# Patient Record
Sex: Female | Born: 1990
Health system: Southern US, Community
[De-identification: ages and names within clinical notes are randomized; demographics above are authoritative.]

## PROBLEM LIST (undated history)

## (undated) ENCOUNTER — Inpatient Hospital Stay (HOSPITAL_COMMUNITY): Payer: Self-pay

## (undated) DIAGNOSIS — D649 Anemia, unspecified: Secondary | ICD-10-CM

## (undated) DIAGNOSIS — F419 Anxiety disorder, unspecified: Secondary | ICD-10-CM

## (undated) DIAGNOSIS — F99 Mental disorder, not otherwise specified: Secondary | ICD-10-CM

## (undated) DIAGNOSIS — B999 Unspecified infectious disease: Secondary | ICD-10-CM

## (undated) DIAGNOSIS — R87619 Unspecified abnormal cytological findings in specimens from cervix uteri: Secondary | ICD-10-CM

## (undated) DIAGNOSIS — IMO0002 Reserved for concepts with insufficient information to code with codable children: Secondary | ICD-10-CM

## (undated) DIAGNOSIS — A599 Trichomoniasis, unspecified: Secondary | ICD-10-CM

## (undated) HISTORY — PX: NO PAST SURGERIES: SHX2092

## (undated) HISTORY — PX: INDUCED ABORTION: SHX677

---
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
## 2009-06-08 ENCOUNTER — Emergency Department (HOSPITAL_COMMUNITY): Admission: EM | Admit: 2009-06-08 | Discharge: 2009-06-08 | Payer: Self-pay | Admitting: Emergency Medicine

## 2009-12-08 ENCOUNTER — Emergency Department (HOSPITAL_COMMUNITY): Admission: EM | Admit: 2009-12-08 | Discharge: 2009-12-08 | Payer: Self-pay | Admitting: Emergency Medicine

## 2010-06-15 ENCOUNTER — Emergency Department (HOSPITAL_COMMUNITY): Admission: EM | Admit: 2010-06-15 | Discharge: 2010-06-15 | Payer: Self-pay | Admitting: Emergency Medicine

## 2010-11-16 ENCOUNTER — Emergency Department (HOSPITAL_COMMUNITY)
Admission: EM | Admit: 2010-11-16 | Discharge: 2010-11-16 | Disposition: A | Discharge status: Home / Self Care | Payer: 59 | Attending: Emergency Medicine | Admitting: Emergency Medicine

## 2010-11-16 ENCOUNTER — Emergency Department (HOSPITAL_COMMUNITY): Payer: 59

## 2010-11-16 DIAGNOSIS — F17200 Nicotine dependence, unspecified, uncomplicated: Secondary | ICD-10-CM | POA: Insufficient documentation

## 2010-11-16 DIAGNOSIS — R059 Cough, unspecified: Secondary | ICD-10-CM | POA: Insufficient documentation

## 2010-11-16 DIAGNOSIS — R05 Cough: Secondary | ICD-10-CM | POA: Insufficient documentation

## 2010-11-16 DIAGNOSIS — J45901 Unspecified asthma with (acute) exacerbation: Secondary | ICD-10-CM | POA: Insufficient documentation

## 2011-09-16 ENCOUNTER — Encounter (HOSPITAL_COMMUNITY): Payer: Self-pay

## 2011-09-16 ENCOUNTER — Inpatient Hospital Stay (HOSPITAL_COMMUNITY): Payer: 59

## 2011-09-16 ENCOUNTER — Inpatient Hospital Stay (HOSPITAL_COMMUNITY)
Admission: AD | Admit: 2011-09-16 | Discharge: 2011-09-16 | Disposition: A | Discharge status: Home / Self Care | Payer: 59 | Source: Ambulatory Visit | Attending: Obstetrics & Gynecology | Admitting: Obstetrics & Gynecology

## 2011-09-16 DIAGNOSIS — R42 Dizziness and giddiness: Secondary | ICD-10-CM | POA: Insufficient documentation

## 2011-09-16 DIAGNOSIS — O26859 Spotting complicating pregnancy, unspecified trimester: Secondary | ICD-10-CM | POA: Insufficient documentation

## 2011-09-16 DIAGNOSIS — O26851 Spotting complicating pregnancy, first trimester: Secondary | ICD-10-CM

## 2011-09-16 HISTORY — DX: Anemia, unspecified: D64.9

## 2011-09-16 LAB — URINALYSIS, ROUTINE W REFLEX MICROSCOPIC
Bilirubin Urine: NEGATIVE
Glucose, UA: NEGATIVE mg/dL
Hgb urine dipstick: NEGATIVE
Ketones, ur: NEGATIVE mg/dL
Leukocytes, UA: NEGATIVE
Nitrite: NEGATIVE
Protein, ur: NEGATIVE mg/dL
Specific Gravity, Urine: 1.015 (ref 1.005–1.030)
Urobilinogen, UA: 0.2 mg/dL (ref 0.0–1.0)
pH: 7 (ref 5.0–8.0)

## 2011-09-16 LAB — ABO/RH: ABO/RH(D): O POS

## 2011-09-16 LAB — POCT PREGNANCY, URINE: Preg Test, Ur: POSITIVE — AB

## 2011-09-16 LAB — HCG, QUANTITATIVE, PREGNANCY: hCG, Beta Chain, Quant, S: 10443 m[IU]/mL — ABNORMAL HIGH (ref <5)

## 2011-09-16 LAB — GLUCOSE, CAPILLARY: Glucose-Capillary: 92 mg/dL (ref 70–99)

## 2011-09-16 LAB — PREGNANCY, URINE: Preg Test, Ur: POSITIVE — AB

## 2011-09-16 NOTE — ED Provider Notes (Signed)
History   Amber Fitzpatrick is a 21 y.o. year old G1P0 female at 6.[redacted] weeks gestation by LMP who presents to [facility] reporting spotting and dizziness every morning for a 1 week.    CSN: [id number]  Arrival date & time 09/16/11  1207   None     Chief Complaint  Patient presents with  . Dizziness  . Vaginal Bleeding  . Abdominal Pain    (Consider location/radiation/quality/duration/timing/severity/associated sxs/prior treatment) HPI  Past Medical History  Diagnosis Date  . Asthma   . Anemia   . MRSA (methicillin resistant Staphylococcus aureus)     Past Surgical History  Procedure Date  . Dilation and curettage of uterus     Family History  Problem Relation Age of Onset  . Anesthesia problems Neg Hx     History  Substance Use Topics  . Smoking status: Current Everyday Smoker -- 0.5 packs/day    Types: Cigarettes  . Smokeless tobacco: Never Used  . Alcohol Use: Yes    OB History    Grav Para Term Preterm Abortions TAB SAB Ect Mult Living   1               Review of Systems  Constitutional: Negative for fever and chills.  Gastrointestinal: Positive for abdominal pain (occassional cramping, R>L). Negative for nausea and vomiting.  Genitourinary: Negative for dysuria and vaginal bleeding.  Neurological: Positive for dizziness. Negative for syncope and weakness.    Allergies  Review of patient's allergies indicates no known allergies.  Home Medications  No current outpatient prescriptions on file.  BP 113/78  Pulse 91  Temp(Src) 98 F (36.7 C) (Oral)  Resp 18  Ht 5' 3.5" (1.613 m)  Wt 62.596 kg (138 lb)  BMI 24.06 kg/m2  SpO2 100%  LMP 08/02/2011  Physical Exam  Constitutional: She is oriented to person, place, and time. She appears well-developed and well-nourished. No distress.  HENT:  Head: Normocephalic.  Eyes: Pupils are equal, round, and reactive to light.  Cardiovascular: Normal rate and regular rhythm.   Pulmonary/Chest: Effort normal.    Abdominal: Soft. There is no tenderness.  Genitourinary: Vagina normal and uterus normal. No bleeding around the vagina.  Neurological: She is alert and oriented to person, place, and time.  Skin: Skin is warm and dry.  Psychiatric: She has a normal mood and affect.    ED Course  Procedures (including critical care time)  Recent Results (from the past 168 hour(s))  URINALYSIS, ROUTINE W REFLEX MICROSCOPIC   Collection Time   09/16/11 12:50 PM      Component Value Range   Color, Urine YELLOW  YELLOW    APPearance CLEAR  CLEAR    Specific Gravity, Urine 1.015  1.005 - 1.030    pH 7.0  5.0 - 8.0    Glucose, UA NEGATIVE  NEGATIVE (mg/dL)   Hgb urine dipstick NEGATIVE  NEGATIVE    Bilirubin Urine NEGATIVE  NEGATIVE    Ketones, ur NEGATIVE  NEGATIVE (mg/dL)   Protein, ur NEGATIVE  NEGATIVE (mg/dL)   Urobilinogen, UA 0.2  0.0 - 1.0 (mg/dL)   Nitrite NEGATIVE  NEGATIVE    Leukocytes, UA NEGATIVE  NEGATIVE   PREGNANCY, URINE   Collection Time   09/16/11 12:50 PM      Component Value Range   Preg Test, Ur POSITIVE (*) NEGATIVE   POCT PREGNANCY, URINE   Collection Time   09/16/11 12:55 PM      Component Value Range  Preg Test, Ur POSITIVE (*) NEGATIVE   GC/CHLAMYDIA PROBE AMP, GENITAL   Collection Time   09/16/11  1:25 PM      Component Value Range   GC Probe Amp, Genital NEGATIVE  NEGATIVE    Chlamydia, DNA Probe NEGATIVE  NEGATIVE   WET PREP, GENITAL   Collection Time   09/16/11  1:25 PM      Component Value Range   Yeast Wet Prep HPF POC NONE SEEN  NONE SEEN    Trich, Wet Prep NONE SEEN  NONE SEEN    Clue Cells Wet Prep HPF POC MODERATE (*) NONE SEEN    WBC, Wet Prep HPF POC FEW (*) NONE SEEN   ABO/RH   Collection Time   09/16/11  1:40 PM      Component Value Range   ABO/RH(D) O POS    HCG, QUANTITATIVE, PREGNANCY   Collection Time   09/16/11  1:40 PM      Component Value Range   hCG, Beta Chain, Quant, S 10443 (*) <5 (mIU/mL)  GLUCOSE, CAPILLARY   Collection Time    09/16/11  1:43 PM      Component Value Range   Glucose-Capillary 92  70 - 99 (mg/dL)   US show 6.0 week GS, +YS, -FP or cardiac activity. CLC on right  Dizziness resolved w/ snack.  1. Spotting complicating pregnancy in first trimester   Dizziness possible due to pregnancy-related hypoglycemia   MDM  D/C home  Start [facility] SAB precautions Recommend frequent snacks and pushing fluids  Dorathy Kinsman 09/16/2011 3:18 PM

## 2011-09-16 NOTE — Discharge Instructions (Signed)
Prenatal Care Providers °[facility]    [facility] ° Phone- [phone]     Phone: [phone] °         °[facility]                      [facility] ° @[place]     Phone: [phone] ° Phone: [phone] °        [facility] °[facility]     Phone: [phone] ° Phone: [phone]   °        [facility] °[facility] [place]                hone: [phone] ° Phone: [phone] °        [facility] °Dr. Bernard Marshall      Phone: [phone] ° Phone: [phone] °        [facility] °[facility]                Phone: [phone] ° Women’s Health  ° Phone:[phone]    [facility] °         Phone: [phone] °[facility] °  Phone: [phone] °

## 2011-09-16 NOTE — Progress Notes (Signed)
Pt states had positive upt last week. LMP-08/02/2011, usually has regular menstrual cycles. Spotting began 1 day after she was supposed to come on this month's cycle, then noted spotting today. These are the only instances where blood was noted. Last intercourse last pm. Denies pain at present. Feels like gas pains, suprapubic cramping. Has felt dizzy upon standing in mornings only. None in afternoon.

## 2011-09-17 NOTE — ED Provider Notes (Signed)
Attestation of Attending Supervision of Advanced Practitioner: Evaluation and management procedures were performed by the PA/NP/CNM/OB Fellow under my supervision/collaboration. Chart reviewed, and agree with management and plan.  Jaynie Collins, M.D. 09/17/2011 5:41 PM

## 2011-09-22 ENCOUNTER — Encounter (HOSPITAL_COMMUNITY): Payer: Self-pay | Admitting: *Deleted

## 2011-09-22 ENCOUNTER — Emergency Department (HOSPITAL_COMMUNITY)
Admission: EM | Admit: 2011-09-22 | Discharge: 2011-09-22 | Disposition: A | Discharge status: Home / Self Care | Payer: 59 | Attending: Emergency Medicine | Admitting: Emergency Medicine

## 2011-09-22 DIAGNOSIS — F4320 Adjustment disorder, unspecified: Secondary | ICD-10-CM

## 2011-09-22 DIAGNOSIS — IMO0002 Reserved for concepts with insufficient information to code with codable children: Secondary | ICD-10-CM | POA: Insufficient documentation

## 2011-09-22 DIAGNOSIS — F17200 Nicotine dependence, unspecified, uncomplicated: Secondary | ICD-10-CM | POA: Insufficient documentation

## 2011-09-22 DIAGNOSIS — Z8614 Personal history of Methicillin resistant Staphylococcus aureus infection: Secondary | ICD-10-CM | POA: Insufficient documentation

## 2011-09-22 DIAGNOSIS — R45851 Suicidal ideations: Secondary | ICD-10-CM

## 2011-09-22 HISTORY — DX: Mental disorder, not otherwise specified: F99

## 2011-09-22 HISTORY — DX: Anxiety disorder, unspecified: F41.9

## 2011-09-22 LAB — ACETAMINOPHEN LEVEL: Acetaminophen (Tylenol), Serum: <15 ug/mL (ref 10–30)

## 2011-09-22 LAB — COMPREHENSIVE METABOLIC PANEL
ALT: 8 U/L (ref 0–35)
Alkaline Phosphatase: 36 U/L — ABNORMAL LOW (ref 39–117)
BUN: 8 mg/dL (ref 6–23)
Chloride: 105 mEq/L (ref 96–112)
GFR calc Af Amer: >90 mL/min (ref >90)
Glucose, Bld: 79 mg/dL (ref 70–99)
Potassium: 3.4 mEq/L — ABNORMAL LOW (ref 3.5–5.1)
Sodium: 136 mEq/L (ref 135–145)
Total Bilirubin: 0.2 mg/dL — ABNORMAL LOW (ref 0.3–1.2)

## 2011-09-22 LAB — ETHANOL: Alcohol, Ethyl (B): <11 mg/dL (ref 0–11)

## 2011-09-22 LAB — RAPID URINE DRUG SCREEN, HOSP PERFORMED
Amphetamines: POSITIVE — AB
Barbiturates: NOT DETECTED

## 2011-09-22 LAB — SALICYLATE LEVEL: Salicylate Lvl: <2 mg/dL — ABNORMAL LOW (ref 2.8–20.0)

## 2011-09-22 LAB — CBC
HCT: 32.6 % — ABNORMAL LOW (ref 36.0–46.0)
Hemoglobin: 11.1 g/dL — ABNORMAL LOW (ref 12.0–15.0)
RBC: 3.95 MIL/uL (ref 3.87–5.11)
WBC: 11.9 10*3/uL — ABNORMAL HIGH (ref 4.0–10.5)

## 2011-09-22 MED ORDER — POTASSIUM CHLORIDE 20 MEQ/15ML (10%) PO LIQD
20.0000 meq | Freq: Once | ORAL | Status: AC
  Administered 2011-09-22: 20 meq via ORAL
  Filled 2011-09-22: qty 15

## 2011-09-22 MED ORDER — ONDANSETRON HCL 4 MG PO TABS: 4.0000 mg | ORAL_TABLET | Freq: Three times a day (TID) | ORAL | Status: DC | PRN

## 2011-09-22 MED ORDER — ACETAMINOPHEN 325 MG PO TABS: 650.0000 mg | ORAL_TABLET | ORAL | Status: DC | PRN

## 2011-09-22 MED ORDER — LORAZEPAM 1 MG PO TABS: 1.0000 mg | ORAL_TABLET | Freq: Three times a day (TID) | ORAL | Status: DC | PRN

## 2011-09-22 NOTE — Discharge Instructions (Signed)
Adjustment Disorder Most changes in life can cause stress. Getting used to changes may take a few months or longer. If feelings of stress, hopelessness, or worry continue, you may have an adjustment disorder. This stress-related mental health problem may affect your feelings, thinking and how you act. It occurs in both sexes and happens at any age. SYMPTOMS  Some of the following problems may be seen and vary from person to person:  Sadness or depression.   Loss of enjoyment.   Thoughts of suicide.   Fighting.   Avoiding family and friends.   Poor school performance.   Hopelessness, sense of loss.   Trouble sleeping.   Vandalism.   Worry, weight loss or gain.   Crying spells.   Anxiety   Reckless driving.   Skipping school.   Poor work International aid/development worker.   Nervousness.   Ignoring bills.   Poor attitude.  DIAGNOSIS  Your caregiver will ask what has happened in your life and do a physical exam. They will make a diagnosis of an adjustment disorder when they are sure another problem or medical illness causing your feelings does not exist. TREATMENT  When problems caused by stress interfere with you daily life or last longer than a few months, you may need counseling for an adjustment disorder. Early treatment may diminish problems and help you to better cope with the stressful events in your life. Sometimes medication is necessary. Individual counseling and or support groups can be very helpful. PROGNOSIS  Adjustment disorders usually last less than 3 to 6 months. The condition may persist if there is long lasting stress. This could include health problems, relationship problems, or job difficulties where you can not easily escape from what is causing the problem. PREVENTION  Even the most mentally healthy, highly functioning people can suffer from an adjustment disorder given a significant blow from a life-changing event. There is no way to prevent pain and loss. Most people  need help from time to time. You are not alone. SEEK MEDICAL CARE IF:  Your feelings or symptoms listed above do not improve or worsen. Document Released: 03/10/2006 Document Revised: 06/25/2011 Document Reviewed: 06/01/2007 [facility] Patient Information 2012 [place], [place].  RESOURCE GUIDE  Dental Problems  Patients with Medicaid: [facility] [phone]                                           [phone] [address] [employer] Phone:  [phone]                                                   Phone:  [phone]  If unable to pay or uninsured, contact:  [facility] or [facility]. to become qualified for the adult dental clinic.  Chronic Pain Problems Contact [facility]  [phone] Patients need to be referred by their primary care doctor.  Insufficient Money for Medicine Contact United Way:  call "211" or [facility] [phone].  No Primary Care Doctor Call [facility]  [phone] Other agencies that provide inexpensive medical care    [facility]  Family Medicine  [phone]    [facility] Internal [facility]  [phone]    [facility]  [phone]    [facility]  [phone]    [facility]  [phone]    [facility]  [phone]  Psychological Services [facility]  [phone] [facility]  [phone] [facility]   [phone] (emergency services [phone])  Abuse/Neglect [facility] Child Abuse Hotline [phone] [facility] Child Abuse Hotline [phone] (After Hours)  Emergency Shelter [facility] [phone]  [facility] [phone] [facility] [phone]  MRSA Hotline #:   708-275-7371    [facility] Resources  [facility]  United Way                           [facility] Dept. [address]  [place]                     [address]         [address]  Blondell Reveal Phone:  [phone]                                  Phone:  [phone]                   Phone:  [phone]  [facility] Mental Health Phone:  [phone]  [facility] Child Abuse Hotline [phone] (After Hours)
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
## 2011-09-22 NOTE — ED Notes (Signed)
Pt states that she just found out she is [redacted] weeks pregnant, wanted to try to have an abortion this morning but father of baby doesn't want her to, states she is afraid to tell her mother. Pt is tearful and states she just doesn't want to be here anymore.

## 2011-09-22 NOTE — ED Notes (Signed)
Pt in via police, c/o SI, police state she came up to car and stated she wanted to hurt herself, pt tearful upon arrival, car is at [employer]
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
## 2011-10-30 ENCOUNTER — Emergency Department (INDEPENDENT_AMBULATORY_CARE_PROVIDER_SITE_OTHER)
Admission: EM | Admit: 2011-10-30 | Discharge: 2011-10-30 | Disposition: A | Discharge status: Home / Self Care | Payer: 59 | Source: Home / Self Care

## 2011-10-30 ENCOUNTER — Encounter (HOSPITAL_COMMUNITY): Payer: Self-pay

## 2011-10-30 DIAGNOSIS — Z711 Person with feared health complaint in whom no diagnosis is made: Secondary | ICD-10-CM

## 2011-10-30 DIAGNOSIS — N898 Other specified noninflammatory disorders of vagina: Secondary | ICD-10-CM

## 2011-10-30 NOTE — ED Provider Notes (Signed)
Amber Fitzpatrick is a 21 y.o. female who presents to Urgent Care today for confirmation of pregnancy test.  Patient recently had therapeutic abortion about 3 weeks ago.  Her friend told her she was eating more than usual and that she was probably pregnant, therefore patient took pregnancy test which came back positive.  She was obviously upset and came straight here for confirmation.  She has engaged in unprotected sexual intercourse since that time.  She denies any nausea or vomiting. No abdominal pain.  No vaginal bleeding.  She has had some vaginal discharge since therapeutic abortion, relieved with Monistat.  No dyuria.     PMH reviewed.  ROS as above otherwise neg Medications reviewed. No current facility-administered medications for this encounter.   Current Outpatient Prescriptions  Medication Sig Dispense Refill  . acetaminophen (TYLENOL) 500 MG tablet Take 1,000 mg by mouth every 6 (six) hours as needed. Takes for pain        Exam:  BP 135/103  Pulse 61  Temp(Src) 98.6 F (37 C) (Oral)  Resp 12  SpO2 98%  LMP 08/02/2011 Gen: Well NAD HEENT: EOMI,  MMM Lungs: CTABL Nl WOB Heart: RRR no MRG Abd: NABS, NT, ND  Assessment and Plan:  1.  Negative pregnancy test here.  Counseled regarding safe sexual practices.  Also counseled regarding obtaining contraception.    2.  Vaginal discharge:  Improved with Monistat.  Likely yeast due to improvement and likely secondary to vaginal pH change after abortion.  FU as needed.   Tobey Grim, MD 10/30/11 2025

## 2011-10-30 NOTE — ED Provider Notes (Signed)
Medical screening examination/treatment/procedure(s) were performed by a resident physician and as supervising physician I was immediately available for consultation/collaboration.  Leslee Home, M.D.   Reuben Likes, MD 10/30/11 2039

## 2011-10-30 NOTE — ED Notes (Signed)
MD went into room to discuss testing outcome, and patient left soon there after w/o notifying staff

## 2011-10-30 NOTE — Discharge Instructions (Signed)
Your pregnancy test was negative.  Make sure to use condoms for protection against sexually transmitted diseases EVERY time you have sex.    Safe Sex Your caregiver wants you to have this information about the infections that can be transmitted from sexual contact and how to prevent them. The idea behind safer sex is that you can be sexually active, and at the same time reduce the risk of giving or getting a sexually transmitted disease (STD). Every person should be aware of how to prevent him or herself and his or her sex partner from getting an STD. CAUSES OF STDS STDs are transmitted by sharing body fluids, which contain viruses and bacteria. The following fluids all transmit infections during sexual intercourse and sex acts:  Semen.   Saliva.   Urine.   Blood.   Vaginal mucus.  Examples of STDs include:  Chlamydia.   Gonorrhea.   Genital herpes.   Hepatitis B.   Human immunodeficiency virus or acquired immunodeficiency syndrome (HIV or AIDS).   Syphilis.   Trichomonas.   Pubic lice.   Human papillomavirus (HPV), which may include:   Genital warts.   Cervical dysplasia.   Cervical cancer (can develop with certain types of HPV).  SYMPTOMS  Sexual diseases often cause few or no symptoms until they are advanced, so a person can be infected and spread the infection without knowing it. Some STDs respond to treatment very well. Others, like HIV and herpes, cannot be cured, but are treated to reduce their effects. Specific symptoms include:  Abnormal vaginal discharge.   Irritation or itching in and around the vagina, and in the pubic hair.   Pain during sexual intercourse.   Bleeding during sexual intercourse.   Pelvic or abdominal pain.   Fever.   Growths in and around the vagina.   An ulcer in or around the vagina.   Swollen glands in the groin area.  DIAGNOSIS   Blood tests.   Pap test.   Culture test of abnormal vaginal discharge.   A test that  applies a solution and examines the cervix with a lighted magnifying scope (colposcopy).   A test that examines the pelvis with a lighted tube, through a small incision (laparoscopy).  TREATMENT  The treatment will depend on the cause of the STD.  Antibiotic treatment by injection, oral, creams, or suppositories in the vagina.   Over-the-counter medicated shampoo, to get rid of pubic lice.   Removing or treating growths with medicine, freezing, burning (electrocautery), or surgery.   Surgery treatment for HPV of the cervix.   Supportive medicines for herpes, HIV, AIDS, and hepatitis.  Being careful cannot eliminate all risk of infection, but sex can be made much safer. Safe sexual practices include body massage and gentle touching. Masturbation is safe, as long as body fluids do not contact skin that has sores or cuts. Dry kissing and oral sex on a man wearing a latex condom or on a woman wearing a female condom is also safe. Slightly less safe is intercourse while the man wears a latex condom or wet kissing. It is also safer to have one sex partner that you know is not having sex with anyone else. LENGTH OF ILLNESS An STD might be treated and cured in a week, sometimes a month, or more. And it can linger with symptoms for many years. STDs can also cause damage to the female organs. This can cause chronic pain, infertility, and recurrence of the STD, especially herpes, hepatitis, HIV,  and HPV. HOME CARE INSTRUCTIONS AND PREVENTION  Alcohol and recreational drugs are often the reason given for not practicing safer sex. These substances affect your judgment. Alcohol and recreational drugs can also impair your immune system, making you more vulnerable to disease.   Do not engage in risky and dangerous sexual practices, including:   Vaginal or anal sex without a condom.   Oral sex on a man without a condom.   Oral sex on a woman without a female condom.   Using saliva to lubricate a  condom.   Any other sexual contact in which body fluids or blood from one partner contact the other partner.   You should use only latex condoms for men and water soluble lubricants. Petroleum based lubricants or oils used to lubricate a condom will weaken the condom and increase the chance that it will break.   Think very carefully before having sex with anyone who is high risk for STDs and HIV. This includes IV drug users, people with multiple sexual partners, or people who have had an STD, or a positive hepatitis or HIV blood test.   Remember that even if your partner has had only one previous partner, their previous partner might have had multiple partners. If so, you are at high risk of being exposed to an STD. You and your sex partner should be the only sex partners with each other, with no one else involved.   A vaccine is available for hepatitis B and HPV through your caregiver or the Public Health Department. Everyone should be vaccinated with these vaccines.   Avoid risky sex practices. Sex acts that can break the skin make you more likely to get an STD.  SEEK MEDICAL CARE IF:   If you think you have an STD, even if you do not have any symptoms. Contact your caregiver for evaluation and treatment, if needed.   You think or know your sex partner has acquired an STD.   You have any of the symptoms mentioned above.  Document Released: 08/13/2004 Document Revised: 06/25/2011 Document Reviewed: 06/05/2009 [facility] Patient Information 2012 [place], [place].

## 2011-10-30 NOTE — ED Notes (Signed)
3-4 weeks voluntary pregnancy termination; no period since ; concerned may be pregnant again

## 2012-02-17 ENCOUNTER — Inpatient Hospital Stay (HOSPITAL_COMMUNITY): Payer: 59

## 2012-02-17 ENCOUNTER — Inpatient Hospital Stay (HOSPITAL_COMMUNITY)
Admission: AD | Admit: 2012-02-17 | Discharge: 2012-02-17 | Disposition: A | Discharge status: Home / Self Care | Payer: 59 | Source: Ambulatory Visit | Attending: Obstetrics and Gynecology | Admitting: Obstetrics and Gynecology

## 2012-02-17 ENCOUNTER — Encounter (HOSPITAL_COMMUNITY): Payer: Self-pay

## 2012-02-17 DIAGNOSIS — O219 Vomiting of pregnancy, unspecified: Secondary | ICD-10-CM

## 2012-02-17 DIAGNOSIS — K5900 Constipation, unspecified: Secondary | ICD-10-CM | POA: Insufficient documentation

## 2012-02-17 DIAGNOSIS — O99891 Other specified diseases and conditions complicating pregnancy: Secondary | ICD-10-CM | POA: Insufficient documentation

## 2012-02-17 DIAGNOSIS — O210 Mild hyperemesis gravidarum: Secondary | ICD-10-CM | POA: Insufficient documentation

## 2012-02-17 LAB — ABO/RH: ABO/RH(D): O POS

## 2012-02-17 LAB — CBC
HCT: 31.9 % — ABNORMAL LOW (ref 36.0–46.0)
MCH: 27.6 pg (ref 26.0–34.0)
MCHC: 32.9 g/dL (ref 30.0–36.0)
MCV: 83.7 fL (ref 78.0–100.0)
Platelets: 258 10*3/uL (ref 150–400)
RDW: 14.9 % (ref 11.5–15.5)
WBC: 8.8 10*3/uL (ref 4.0–10.5)

## 2012-02-17 LAB — COMPREHENSIVE METABOLIC PANEL
AST: 20 U/L (ref 0–37)
Albumin: 3.5 g/dL (ref 3.5–5.2)
BUN: 5 mg/dL — ABNORMAL LOW (ref 6–23)
Calcium: 9 mg/dL (ref 8.4–10.5)
Chloride: 99 mEq/L (ref 96–112)
Creatinine, Ser: 0.64 mg/dL (ref 0.50–1.10)
Total Bilirubin: 0.2 mg/dL — ABNORMAL LOW (ref 0.3–1.2)
Total Protein: 6.8 g/dL (ref 6.0–8.3)

## 2012-02-17 LAB — URINALYSIS, ROUTINE W REFLEX MICROSCOPIC
Hgb urine dipstick: NEGATIVE
Nitrite: NEGATIVE
Protein, ur: NEGATIVE mg/dL
Specific Gravity, Urine: 1.015 (ref 1.005–1.030)
Urobilinogen, UA: 0.2 mg/dL (ref 0.0–1.0)

## 2012-02-17 LAB — HCG, QUANTITATIVE, PREGNANCY: hCG, Beta Chain, Quant, S: 20825 m[IU]/mL — ABNORMAL HIGH (ref <5)

## 2012-02-17 LAB — URINE MICROSCOPIC-ADD ON

## 2012-02-17 LAB — POCT PREGNANCY, URINE: Preg Test, Ur: POSITIVE — AB

## 2012-02-17 MED ORDER — DOCUSATE SODIUM 100 MG PO CAPS: 100.0000 mg | ORAL_CAPSULE | Freq: Two times a day (BID) | ORAL | Status: DC

## 2012-02-17 MED ORDER — PROMETHAZINE HCL 25 MG PO TABS: 25.0000 mg | ORAL_TABLET | Freq: Four times a day (QID) | ORAL | Status: DC | PRN

## 2012-02-17 MED ORDER — ONDANSETRON 8 MG PO TBDP: 8.0000 mg | ORAL_TABLET | Freq: Three times a day (TID) | ORAL | Status: DC | PRN

## 2012-02-17 MED ORDER — FAMOTIDINE IN NACL 20-0.9 MG/50ML-% IV SOLN: 20.0000 mg | Freq: Once | INTRAVENOUS | Status: DC

## 2012-02-17 MED ORDER — PROMETHAZINE HCL 25 MG/ML IJ SOLN
25.0000 mg | Freq: Once | INTRAVENOUS | Status: AC
  Administered 2012-02-17: 25 mg via INTRAVENOUS
  Filled 2012-02-17: qty 1

## 2012-02-17 MED ORDER — THIAMINE HCL 100 MG/ML IJ SOLN: Freq: Once | INTRAVENOUS | Status: DC

## 2012-02-17 NOTE — MAU Provider Note (Signed)
History     CSN: [id number]  Arrival date and time: 02/17/12 0901   First Provider Initiated Contact with Patient 02/17/12 [phone]      Chief Complaint  Patient presents with  . Emesis   HPI  Pt is early pregnant with positive home pregnancy test.  Pt has had nausea and vomiting with abdominal cramping for about 1 month.  Pt had started BCP that she started but was having nausea and vomiting.   Pt had bleeding with her last menstrual period that was "stinky".  She has had constipation with straining this morning.    Past Medical History  Diagnosis Date  . Asthma   . Anemia   . MRSA (methicillin resistant Staphylococcus aureus)   . Anxiety   . Mental disorder   . Depression     Past Surgical History  Procedure Date  . Dilation and curettage of uterus     Family History  Problem Relation Age of Onset  . Anesthesia problems Neg Hx   . Other Neg Hx     History  Substance Use Topics  . Smoking status: Current Everyday Smoker  . Smokeless tobacco: Never Used  . Alcohol Use: No    Allergies: No Known Allergies  Prescriptions prior to admission  Medication Sig Dispense Refill  . acetaminophen (TYLENOL) 500 MG tablet Take 1,000 mg by mouth every 6 (six) hours as needed. Takes for pain        Review of Systems  Constitutional: Negative for fever and chills.  Gastrointestinal: Positive for nausea. Negative for vomiting, diarrhea and constipation.  Genitourinary: Negative for dysuria and urgency.   Physical Exam   Blood pressure 145/95, pulse 84, temperature 99.4 F (37.4 C), temperature source Oral, resp. rate 16, last menstrual period 01/04/2012, SpO2 100.00%, not currently breastfeeding.  Physical Exam  Nursing note and vitals reviewed. Constitutional: She appears well-developed and well-nourished.  HENT:  Head: Normocephalic.  Eyes: Pupils are equal, round, and reactive to light.  Neck: Normal range of motion. Neck supple.  Cardiovascular: Normal rate.     Respiratory: Effort normal.  GI: Soft.  Musculoskeletal: Normal range of motion.  Neurological: She is alert.  Skin: Skin is warm and dry.  Psychiatric: She has a normal mood and affect.    MAU Course  Procedures Results for orders placed during the hospital encounter of 02/17/12 (from the past 24 hour(s))  URINALYSIS, ROUTINE W REFLEX MICROSCOPIC     Status: Abnormal   Collection Time   02/17/12  9:05 AM      Component Value Range   Color, Urine YELLOW  YELLOW   APPearance HAZY (*) CLEAR   Specific Gravity, Urine 1.015  1.005 - 1.030   pH 8.5 (*) 5.0 - 8.0   Glucose, UA NEGATIVE  NEGATIVE mg/dL   Hgb urine dipstick NEGATIVE  NEGATIVE   Bilirubin Urine NEGATIVE  NEGATIVE   Ketones, ur NEGATIVE  NEGATIVE mg/dL   Protein, ur NEGATIVE  NEGATIVE mg/dL   Urobilinogen, UA 0.2  0.0 - 1.0 mg/dL   Nitrite NEGATIVE  NEGATIVE   Leukocytes, UA SMALL (*) NEGATIVE  URINE MICROSCOPIC-ADD ON     Status: Abnormal   Collection Time   02/17/12  9:05 AM      Component Value Range   Squamous Epithelial / LPF FEW (*) RARE   RBC / HPF 7-10  <3 RBC/hpf   Bacteria, UA MANY (*) RARE  POCT PREGNANCY, URINE     Status: Abnormal   Collection  Time   02/17/12  9:11 AM      Component Value Range   Preg Test, Ur POSITIVE (*) NEGATIVE  CBC     Status: Abnormal   Collection Time   02/17/12 10:15 AM      Component Value Range   WBC 8.8  4.0 - 10.5 K/uL   RBC 3.81 (*) 3.87 - 5.11 MIL/uL   Hemoglobin 10.5 (*) 12.0 - 15.0 g/dL   HCT 16.1 (*) 09.6 - 04.5 %   MCV 83.7  78.0 - 100.0 fL   MCH 27.6  26.0 - 34.0 pg   MCHC 32.9  30.0 - 36.0 g/dL   RDW 40.9  81.1 - 91.4 %   Platelets 258  150 - 400 K/uL  COMPREHENSIVE METABOLIC PANEL     Status: Abnormal   Collection Time   02/17/12 10:15 AM      Component Value Range   Sodium 130 (*) 135 - 145 mEq/L   Potassium 4.0  3.5 - 5.1 mEq/L   Chloride 99  96 - 112 mEq/L   CO2 25  19 - 32 mEq/L   Glucose, Bld 173 (*) 70 - 99 mg/dL   BUN 5 (*) 6 - 23 mg/dL    Creatinine, Ser 7.82  0.50 - 1.10 mg/dL   Calcium 9.0  8.4 - 95.6 mg/dL   Total Protein 6.8  6.0 - 8.3 g/dL   Albumin 3.5  3.5 - 5.2 g/dL   AST 20  0 - 37 U/L   ALT 19  0 - 35 U/L   Alkaline Phosphatase 38 (*) 39 - 117 U/L   Total Bilirubin 0.2 (*) 0.3 - 1.2 mg/dL   GFR calc non Af Amer >90  >90 mL/min   GFR calc Af Amer >90  >90 mL/min  HCG, QUANTITATIVE, PREGNANCY     Status: Abnormal   Collection Time   02/17/12 10:15 AM      Component Value Range   hCG, Beta Chain, Quant, S 20825 (*) <5 mIU/mL  ABO/RH     Status: Normal   Collection Time   02/17/12 10:15 AM      Component Value Range   ABO/RH(D) O POS    IV hydration with antiemetics given and pt felt much better- needed to leave to go to work before pelvic exam performed and official ultrasound report.    Assessment and Plan  Nausea and vomiting in pregnancy-Rx for phenergan and Zofran Constipation-Colace Single living IUP [redacted]w[redacted]d pregnant - f/u with OB care Amber Fitzpatrick 02/17/2012, 9:55 AM

## 2012-02-17 NOTE — MAU Note (Signed)
Pt states has been constipated, having severe abdominal cramping, no bleeding.

## 2012-02-17 NOTE — MAU Note (Signed)
No adverse effect from phenergan, states nausea is better.

## 2012-02-17 NOTE — MAU Note (Signed)
Pt refused discharge blood pressure and heart hate, states she cannot be late for calling into work.

## 2012-02-18 NOTE — MAU Provider Note (Signed)
Agree with above note.  Margarita Croke 02/18/2012 9:52 AM   

## 2012-02-25 ENCOUNTER — Encounter (HOSPITAL_COMMUNITY): Payer: Self-pay | Admitting: Obstetrics and Gynecology

## 2012-02-25 ENCOUNTER — Inpatient Hospital Stay (HOSPITAL_COMMUNITY)
Admission: AD | Admit: 2012-02-25 | Discharge: 2012-02-25 | Disposition: A | Discharge status: Home / Self Care | Payer: 59 | Source: Ambulatory Visit | Attending: Obstetrics & Gynecology | Admitting: Obstetrics & Gynecology

## 2012-02-25 DIAGNOSIS — R55 Syncope and collapse: Secondary | ICD-10-CM

## 2012-02-25 DIAGNOSIS — A5901 Trichomonal vulvovaginitis: Secondary | ICD-10-CM | POA: Insufficient documentation

## 2012-02-25 DIAGNOSIS — O2650 Maternal hypotension syndrome, unspecified trimester: Secondary | ICD-10-CM | POA: Insufficient documentation

## 2012-02-25 DIAGNOSIS — IMO0002 Reserved for concepts with insufficient information to code with codable children: Secondary | ICD-10-CM | POA: Insufficient documentation

## 2012-02-25 DIAGNOSIS — O219 Vomiting of pregnancy, unspecified: Secondary | ICD-10-CM

## 2012-02-25 DIAGNOSIS — O26819 Pregnancy related exhaustion and fatigue, unspecified trimester: Secondary | ICD-10-CM

## 2012-02-25 DIAGNOSIS — O210 Mild hyperemesis gravidarum: Secondary | ICD-10-CM | POA: Insufficient documentation

## 2012-02-25 DIAGNOSIS — O98819 Other maternal infectious and parasitic diseases complicating pregnancy, unspecified trimester: Secondary | ICD-10-CM | POA: Insufficient documentation

## 2012-02-25 HISTORY — DX: Unspecified abnormal cytological findings in specimens from cervix uteri: R87.619

## 2012-02-25 HISTORY — DX: Reserved for concepts with insufficient information to code with codable children: IMO0002

## 2012-02-25 LAB — URINALYSIS, ROUTINE W REFLEX MICROSCOPIC
Bilirubin Urine: NEGATIVE
Ketones, ur: NEGATIVE mg/dL
Nitrite: NEGATIVE
Specific Gravity, Urine: 1.02 (ref 1.005–1.030)
Urobilinogen, UA: 0.2 mg/dL (ref 0.0–1.0)
pH: 7.5 (ref 5.0–8.0)

## 2012-02-25 LAB — URINE MICROSCOPIC-ADD ON

## 2012-02-25 MED ORDER — PRENATAL PLUS 27-1 MG PO TABS: 1.0000 | ORAL_TABLET | Freq: Every day | ORAL | Status: DC

## 2012-02-25 MED ORDER — METRONIDAZOLE 500 MG PO TABS: 500.0000 mg | ORAL_TABLET | Freq: Two times a day (BID) | ORAL | Status: DC

## 2012-02-25 MED ORDER — METRONIDAZOLE 500 MG PO TABS: 500.0000 mg | ORAL_TABLET | Freq: Two times a day (BID) | ORAL | Status: AC

## 2012-02-25 NOTE — MAU Note (Signed)
Patient states she has been feeling weak and continues to have vomiting almost everything she eats. Has an RX for medication but has not gotten it filled. Denies pain or bleeding.

## 2012-02-25 NOTE — MAU Provider Note (Signed)
Amber Fitzpatrick 64 y.o.[id number] @[redacted]w[redacted]d  by LMP Chief Complaint  Patient presents with  . Fatigue  . Emesis During Pregnancy     None     SUBJECTIVE  HPI: One week history of nausea and vomiting in pregnancy and now with increased fatigue. Describes dizziness with standing today. Has never fainted. Has not been eating due to decreased appetite and nausea. Has not vomited since last visit here and did not fill her prescriptions for Phenergan and Zofran. Denies vaginal itching or dysuria.Seen for N/V here on 02/17/2012 on which day ultrasound confirmed IUP 6 week 2 day. CBC and CMP were normal. Denies abdominal pain or vaginal bleeding. No Medicaid or prenatal care.  Past Medical History  Diagnosis Date  . Asthma   . Anemia   . MRSA (methicillin resistant Staphylococcus aureus)   . Anxiety   . Mental disorder   . Depression    Past Surgical History  Procedure Date  . Dilation and curettage of uterus    History   Social History  . Marital Status: Single    Spouse Name: N/A    Number of Children: N/A  . Years of Education: N/A   Occupational History  . Not on file.   Social History Main Topics  . Smoking status: Current Everyday Smoker  . Smokeless tobacco: Never Used  . Alcohol Use: No  . Drug Use: Yes    Special: Marijuana  . Sexually Active: Yes    Birth Control/ Protection: None   Other Topics Concern  . Not on file   Social History Narrative  . No narrative on file   No current facility-administered medications on file prior to encounter.   Current Outpatient Prescriptions on File Prior to Encounter  Medication Sig Dispense Refill  . acetaminophen (TYLENOL) 500 MG tablet Take 1,000 mg by mouth every 6 (six) hours as needed. Takes for pain      . docusate sodium (COLACE) 100 MG capsule Take 1 capsule (100 mg total) by mouth every 12 (twelve) hours.  60 capsule  0  . ondansetron (ZOFRAN ODT) 8 MG disintegrating tablet Take 1 tablet (8 mg total) by mouth every 8  (eight) hours as needed for nausea.  20 tablet  0  . promethazine (PHENERGAN) 25 MG tablet Take 1 tablet (25 mg total) by mouth every 6 (six) hours as needed for nausea.  30 tablet  0   No Known Allergies  ROS: Pertinent items in HPI  OBJECTIVE Blood pressure 115/72, pulse 67, temperature 98.8 F (37.1 C), temperature source Oral, resp. rate 16, height 5\' 2"  (1.575 m), weight 57.425 kg (126 lb 9.6 oz), last menstrual period 01/04/2012, SpO2 100.00%. Orthostatics: no significant changes  GENERAL: Well-developed, well-nourished female in no acute distress.  HEENT: Normocephalic, good dentition HEART: normal rate and thythm RESP: normal effort ABDOMEN: Soft, nontender EXTREMITIES: Nontender, no edema NEURO: Alert and oriented  LAB RESULTS  Results for orders placed during the hospital encounter of 02/25/12 (from the past 24 hour(s))  URINALYSIS, ROUTINE W REFLEX MICROSCOPIC     Status: Abnormal   Collection Time   02/25/12 10:16 AM      Component Value Range   Color, Urine YELLOW  YELLOW   APPearance CLOUDY (*) CLEAR   Specific Gravity, Urine 1.020  1.005 - 1.030   pH 7.5  5.0 - 8.0   Glucose, UA NEGATIVE  NEGATIVE mg/dL   Hgb urine dipstick NEGATIVE  NEGATIVE   Bilirubin Urine NEGATIVE  NEGATIVE  Ketones, ur NEGATIVE  NEGATIVE mg/dL   Protein, ur NEGATIVE  NEGATIVE mg/dL   Urobilinogen, UA 0.2  0.0 - 1.0 mg/dL   Nitrite NEGATIVE  NEGATIVE   Leukocytes, UA SMALL (*) NEGATIVE  URINE MICROSCOPIC-ADD ON     Status: Abnormal   Collection Time   02/25/12 10:16 AM      Component Value Range   Squamous Epithelial / LPF MANY (*) RARE   WBC, UA 11-20  <3 WBC/hpf   Bacteria, UA MANY (*) RARE   Urine-Other TRICHOMONAS PRESENT        ASSESSMENT G1 at [redacted]w[redacted]d IUP N/V and fatigue of early pregnancy Presyncope Trichomonas vaginitis  PLAN  Medication List  As of 02/25/2012 10:26 AM   ASK your doctor about these medications         acetaminophen 500 MG tablet   Commonly known as:  TYLENOL   Take 1,000 mg by mouth every 6 (six) hours as needed. Takes for pain      docusate sodium 100 MG capsule   Commonly known as: COLACE   Take 1 capsule (100 mg total) by mouth every 12 (twelve) hours.      ondansetron 8 MG disintegrating tablet   Commonly known as: ZOFRAN-ODT   Take 1 tablet (8 mg total) by mouth every 8 (eight) hours as needed for nausea.      promethazine 25 MG tablet   Commonly known as: PHENERGAN   Take 1 tablet (25 mg total) by mouth every 6 (six) hours as needed for nausea.           Information on [facility] providers and obtaining [facility] for pregnancy. Advised to fill prescription for Phenergan. High iron foods and small frequent meals. Slow position changes. Take prenatal vitamin one a day and if tolerable take one iron tablet a day. Work excuse for today. Rx: Flagyl to begin taking when nausea abates.      Amber Fitzpatrick 02/25/2012 10:26 AM

## 2012-02-25 NOTE — MAU Note (Signed)
Been feeling so weak, try to eat... Just can't.

## 2012-02-25 NOTE — MAU Note (Signed)
Had to lay down, while checking orthostatics- started to feel dizzy.

## 2012-03-23 LAB — OB RESULTS CONSOLE GC/CHLAMYDIA: Chlamydia: NEGATIVE

## 2012-03-28 ENCOUNTER — Encounter (HOSPITAL_COMMUNITY): Payer: Self-pay | Admitting: *Deleted

## 2012-03-28 ENCOUNTER — Inpatient Hospital Stay (HOSPITAL_COMMUNITY)
Admission: AD | Admit: 2012-03-28 | Discharge: 2012-04-01 | DRG: 781 | Disposition: A | Discharge status: Home / Self Care | Payer: 59 | Source: Ambulatory Visit | Attending: Obstetrics | Admitting: Obstetrics

## 2012-03-28 DIAGNOSIS — O211 Hyperemesis gravidarum with metabolic disturbance: Secondary | ICD-10-CM

## 2012-03-28 DIAGNOSIS — E876 Hypokalemia: Secondary | ICD-10-CM | POA: Diagnosis present

## 2012-03-28 DIAGNOSIS — O210 Mild hyperemesis gravidarum: Principal | ICD-10-CM | POA: Diagnosis present

## 2012-03-28 HISTORY — DX: Unspecified infectious disease: B99.9

## 2012-03-28 LAB — COMPREHENSIVE METABOLIC PANEL
ALT: 9 U/L (ref 0–35)
AST: 14 U/L (ref 0–37)
CO2: 25 mEq/L (ref 19–32)
Chloride: 97 mEq/L (ref 96–112)
Creatinine, Ser: 0.61 mg/dL (ref 0.50–1.10)
GFR calc non Af Amer: >90 mL/min (ref >90)
Sodium: 134 mEq/L — ABNORMAL LOW (ref 135–145)
Total Bilirubin: 0.2 mg/dL — ABNORMAL LOW (ref 0.3–1.2)

## 2012-03-28 LAB — CBC
MCV: 83.8 fL (ref 78.0–100.0)
Platelets: 295 10*3/uL (ref 150–400)
RBC: 4.07 MIL/uL (ref 3.87–5.11)
WBC: 13.9 10*3/uL — ABNORMAL HIGH (ref 4.0–10.5)

## 2012-03-28 LAB — URINALYSIS, ROUTINE W REFLEX MICROSCOPIC
Bilirubin Urine: NEGATIVE
Ketones, ur: NEGATIVE mg/dL
Nitrite: NEGATIVE
Protein, ur: NEGATIVE mg/dL
pH: 7 (ref 5.0–8.0)

## 2012-03-28 LAB — URINE MICROSCOPIC-ADD ON

## 2012-03-28 MED ORDER — METHYLPREDNISOLONE 4 MG PO TABS: 4.0000 mg | ORAL_TABLET | Freq: Every day | ORAL | Status: DC

## 2012-03-28 MED ORDER — METHYLPREDNISOLONE 4 MG PO TABS
8.0000 mg | ORAL_TABLET | Freq: Every day | ORAL | Status: DC
  Filled 2012-03-28: qty 2

## 2012-03-28 MED ORDER — LACTATED RINGERS IV SOLN: INTRAVENOUS | Status: DC

## 2012-03-28 MED ORDER — METHYLPREDNISOLONE 16 MG PO TABS
16.0000 mg | ORAL_TABLET | Freq: Every day | ORAL | Status: AC
  Administered 2012-03-29 – 2012-04-01 (×4): 16 mg via ORAL
  Filled 2012-03-28 (×4): qty 1

## 2012-03-28 MED ORDER — METRONIDAZOLE 500 MG PO TABS
500.0000 mg | ORAL_TABLET | Freq: Two times a day (BID) | ORAL | Status: DC
  Administered 2012-03-29 (×2): 500 mg via ORAL
  Filled 2012-03-28 (×5): qty 1

## 2012-03-28 MED ORDER — POTASSIUM CHLORIDE 2 MEQ/ML IV SOLN
INTRAVENOUS | Status: DC
  Administered 2012-03-28 – 2012-03-29 (×3): via INTRAVENOUS
  Filled 2012-03-28 (×7): qty 1000

## 2012-03-28 MED ORDER — METHYLPREDNISOLONE 4 MG PO TABS
8.0000 mg | ORAL_TABLET | Freq: Every day | ORAL | Status: DC
  Administered 2012-03-31 – 2012-04-01 (×2): 8 mg via ORAL
  Filled 2012-03-28 (×3): qty 2

## 2012-03-28 MED ORDER — PANTOPRAZOLE SODIUM 40 MG IV SOLR
40.0000 mg | Freq: Two times a day (BID) | INTRAVENOUS | Status: DC
  Administered 2012-03-28 – 2012-03-29 (×3): 40 mg via INTRAVENOUS
  Filled 2012-03-28 (×4): qty 40

## 2012-03-28 MED ORDER — METHYLPREDNISOLONE SODIUM SUCC 40 MG IJ SOLR
16.0000 mg | INTRAMUSCULAR | Status: AC
  Administered 2012-03-28 (×3): 16 mg via INTRAVENOUS
  Filled 2012-03-28 (×3): qty 0.4

## 2012-03-28 MED ORDER — METHYLPREDNISOLONE 16 MG PO TABS
16.0000 mg | ORAL_TABLET | Freq: Every day | ORAL | Status: AC
  Administered 2012-03-29 – 2012-03-31 (×3): 16 mg via ORAL
  Filled 2012-03-28 (×3): qty 1

## 2012-03-28 MED ORDER — PROMETHAZINE HCL 25 MG RE SUPP
25.0000 mg | RECTAL | Status: DC | PRN
  Administered 2012-03-30: 25 mg via RECTAL
  Filled 2012-03-28: qty 1

## 2012-03-28 MED ORDER — ONDANSETRON HCL 4 MG/2ML IJ SOLN
INTRAMUSCULAR | Status: DC
  Administered 2012-03-28: 15:00:00 via INTRAVENOUS
  Filled 2012-03-28 (×2): qty 1000

## 2012-03-28 MED ORDER — ONDANSETRON 4 MG PO TBDP
4.0000 mg | ORAL_TABLET | Freq: Three times a day (TID) | ORAL | Status: DC | PRN
  Administered 2012-03-28: 4 mg via ORAL
  Filled 2012-03-28: qty 1

## 2012-03-28 MED ORDER — METHYLPREDNISOLONE 16 MG PO TABS
16.0000 mg | ORAL_TABLET | Freq: Every day | ORAL | Status: AC
  Administered 2012-03-29 – 2012-03-30 (×2): 16 mg via ORAL
  Filled 2012-03-28 (×2): qty 1

## 2012-03-28 MED ORDER — ZOLPIDEM TARTRATE 5 MG PO TABS: 5.0000 mg | ORAL_TABLET | Freq: Every evening | ORAL | Status: DC | PRN

## 2012-03-28 MED ORDER — ONDANSETRON HCL 4 MG/2ML IJ SOLN: 4.0000 mg | Freq: Three times a day (TID) | INTRAMUSCULAR | Status: DC

## 2012-03-28 MED ORDER — ALBUTEROL SULFATE HFA 108 (90 BASE) MCG/ACT IN AERS
1.0000 | INHALATION_SPRAY | RESPIRATORY_TRACT | Status: DC | PRN
  Administered 2012-03-28 – 2012-03-30 (×4): 1 via RESPIRATORY_TRACT
  Filled 2012-03-28: qty 6.7

## 2012-03-28 NOTE — MAU Note (Signed)
Keep throwning up, hasn't eaten a meal in 3 days.  Has been given meds-not working,  Is so thirstyl .  When questioned further regarding intake, states is drinking ensure and has kept it down.

## 2012-03-28 NOTE — Progress Notes (Signed)
INITIAL ADULT NUTRITION ASSESSMENT Date: 03/28/2012   Time: 3:45 PM   INTERVENTION: C/L diet, advance to bland diet with snacks when vomiting resolves Consider addition of MVI to IVF If no resolution of hyperemesis with methylprednisolone therapy, consider tube feedings, as pt meets criteria for severe malnutrition  Reason for Assessment: Hyperemesis  ASSESSMENT: Female 21 y.o.  Dx: There is no problem list on file for this patient.   Hx:  Past Medical History  Diagnosis Date  . Asthma   . Anemia   . MRSA (methicillin resistant Staphylococcus aureus)   . Anxiety   . Mental disorder   . Abnormal Pap smear     f/u ok  . Infection    Related Meds:     . methylPREDNISolone  16 mg Oral Q breakfast   Followed by  . methylPREDNISolone  8 mg Oral Q breakfast   Followed by  . methylPREDNISolone  4 mg Oral Q breakfast  . methylPREDNISolone  16 mg Oral Q1400   Followed by  . methylPREDNISolone  8 mg Oral Q1400   Followed by  . methylPREDNISolone  4 mg Oral Q1400  . methylPREDNISolone  16 mg Oral QHS   Followed by  . methylPREDNISolone  8 mg Oral QHS   Followed by  . methylPREDNISolone  4 mg Oral QHS  . methylPREDNISolone (SOLU-MEDROL) injection  16 mg Intravenous [id number]  . metroNIDAZOLE  500 mg Oral BID  . DISCONTD: ondansetron (ZOFRAN) IV  4 mg Intravenous Q8H    Ht: 5\' 3"  (160 cm)  Wt: 114 lb 7 oz (51.909 kg) (bed scale)  Ideal Wt: 52.4 kg( 115 Lbs) % Ideal Wt: 100%  Usual Wt: 128 Lbs % Usual Wt: 89%  Body mass index is 20.27 kg/(m^2).  Food/Nutrition Related Hx: Pt reports that she is [redacted] weeks pregnant. Reports sever hyperemesis symptoms for past 6 weeks with a 14 pound weight loss. Unable to consume  more than 1 meal a day  For the past 6 weeks, usually less inatke. At the time of my visit pt was shaking, weak, unable to attend to lengthy conversation.  Labs:  CMP     Component Value Date/Time   NA 134* 03/28/2012 1432   K 3.2* 03/28/2012 1432   CL 97  03/28/2012 1432   CO2 25 03/28/2012 1432   GLUCOSE 78 03/28/2012 1432   BUN 6 03/28/2012 1432   CREATININE 0.61 03/28/2012 1432   CALCIUM 10.3 03/28/2012 1432   PROT 7.5 03/28/2012 1432   ALBUMIN 3.9 03/28/2012 1432   AST 14 03/28/2012 1432   ALT 9 03/28/2012 1432   ALKPHOS 33* 03/28/2012 1432   BILITOT 0.2* 03/28/2012 1432   GFRNONAA >90 03/28/2012 1432   GFRAA >90 03/28/2012 1432    Intake: No intake or output data in the 24 hours ending 03/28/12 1550  Output:   Diet Order: Clear Liquid  Supplements/Tube Feeding:  IVF:    lactated ringers 1,000 mL with ondansetron (ZOFRAN) 4 mg infusion Last Rate: 125 mL/hr at 03/28/12 1501  lactated ringers     Estimated Nutritional Needs:   Kcal: 17-1900 Protein: 64-74 grams Fluid: 2 liters  NUTRITION DIAGNOSIS: -Inadequate oral intake (NI-2.1).  Status: Ongoing R/t hyperemesis aeb nausea, vomiting for 6 weeks , weight loss of 11 % usual weight  MONITORING/EVALUATION(Goals): Tolerance of diet adavnce, po intake that will meet 100 % of estimated needs for pregnancy  EDUCATION NEEDS: -Education not appropriate at this time Handout on diet for hyperemesis left at bedside.  Pt could not attend to instruction. Was able to stress how diet advanced and to consume small very frequent meals of bland foods  Elisabeth Cara M.Odis Luster LDN Neonatal Nutrition Support Specialist Pager [phone]   DOCUMENTATION CODES Per approved criteria  -Severe malnutrition in the context of acute illness or injury  Pt meets above criteiai due to a > 5 % loss of usual weight in one month and intake that is < 50 % of estimated needs for > 5 days  03/28/2012, 3:45 PM

## 2012-03-28 NOTE — H&P (Signed)
This is Dr. Francoise Ceo dictating the history and physical on  Amber Fitzpatrick she's a 21 year old gravida 2 para 0010 at the level weeks pregnant her EDC is 10/10/2012 and ampicillin she's been pregnant she's had severe nausea vomiting she's lost almost 30 pounds and he is in today for the same problem her potassium 3.2 which was started on KCl 40 mEq to each IV she is on Zofran by mouth Phenergan per rectum and the a steroid taper IV Past medical history negative Past surgical history negative Social history negative System review negative Physical exam revealed this well-developed female in mild distress HEENT negative Clear lungs clear to P&A Heart regular rhythm no murmurs no gallops Breasts negative Abdomen nontender Uterus 10-12 weeks size Extremities negative

## 2012-03-28 NOTE — MAU Provider Note (Signed)
History     CSN: [id number]  Arrival date and time: 03/28/12 1107  Provider by bedside at 1 pm.   Chief Complaint  Patient presents with  . Emesis   HPI This is a 21 y.o. G2P0010 at [redacted]w[redacted]d here with nausea present entire pregnancy and a >20lb weight loss.  Pt states she has had chronic nausea all pregnancy which worsened 3 days ago to the point of not being able to keep anything down.  She vomits hourly with no bloody emesis and had some non-bloody diarrhea 2 days ago.  Last night, almost passed out, saw black dots, and normal vision resumed after 2 minutes.  Has had contractions twice daily that last a few minutes starting this week.  Denies vaginal bleeding or abnormal discharge.  Denies sick contacts.  Has tried zofran and something else she cannot recall name of; neither has worked  She has also had some chest tightness and left arm pain; tightness feels like breathing through a straw and sometimes makes it difficult to sleep, feeling like asthma attack; pt reports running out of albuterol and thinks tightness is due to smoking weed recently.   She reports a little abdominal pain.  Prenatal care with Dr. Gaynell Face.  Has had morning sickness throughout pregnancy.  Past Medical History  Diagnosis Date  . Asthma   . Anemia   . MRSA (methicillin resistant Staphylococcus aureus)   . Anxiety   . Mental disorder   . Abnormal Pap smear     f/u ok  . Infection   h/o trichomonas, still on abx   Past Surgical History  Procedure Date  . Dilation and curettage of uterus     Family History  Problem Relation Age of Onset  . Anesthesia problems Neg Hx   . Other Neg Hx   . Hypertension Mother   . Hyperlipidemia Mother   . 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 Sulfa Antibiotics Rash    Prescriptions prior to admission  Medication Sig Dispense Refill  . metroNIDAZOLE (FLAGYL) 500 MG tablet Take 500 mg by mouth 2 (two) times daily. Pt has not yet completed the course of this medication      . Multiple Vitamins-Minerals (ONE-A-DAY WOMENS VITACRAVES) CHEW Chew 1 tablet by mouth daily.        ROS Physical Exam   Blood pressure 116/87, pulse 74, temperature 98.1 F (36.7 C), temperature source Axillary, resp. rate 20, height 5\' 3"  (1.6 m), weight 52.617 kg (116 lb), last menstrual period 01/04/2012.  Physical Exam GEN: Lying in bed curled in fetal position, occasionally burping into emesis bag, appearing mildly uncomfortable CV: RRR, 1+ DP pulses bilaterally PULM: left lung slightly rhonchorous, right lung with no air movement, no increased WOB ABD: soft, tender to palpation LLQ, nondistended, NABS HEENT: MMM   MAU Course  Procedures  MDM Discussed with Dr. Gaynell Face  Assessment and Plan  This is a 22 y.o. G2P0010 at [redacted]w[redacted]d here with nausea present entire pregnancy and a >20lb weight loss.  1. Hyperemesis gravidarum - Significant weight loss since start of pregnancy - Admit to [facility] for rehydration and monitoring - CBC, CMET - Clear liquid diet, advance as tolerated - Zofran 4mg  q8 hours - Medrol taper protocol per pharmacy - LR  1000 cc every 8 hours - Dietitian consult - Ambien prn  2. Asthma - pt currently with chest tightness and decreased air movement entire right lung - Albuterol treatment prn - Monitor O2 sats  Simone Curia 03/28/2012, 1:07 PM

## 2012-03-28 NOTE — Progress Notes (Signed)
Pharmacy Consult:   MEDROL (METHYLPREDNISOLONE) TAPER  FOR HYPEREMESIS GRAVIDARUM PATIENTS  The following is a 14 day taper of methylprednisolone for hyperemesis. Doses on day 1 will be given IV.  All doses starting on day 2  will be given PO. (If patient cannot tolerate oral medications, contact the pharmacy to change route to IV.)   Date Day Morning Midday Bedtime  03/28/2012 1 16 mg 16 mg 16 mg  9/10 2 16  mg 16 mg 16 mg  9/11 3 16  mg 16 mg 16 mg  9/12 4 16  mg 8 mg 16 mg  9/13 5 16  mg 8 mg 8 mg  9/14 6 8  mg 8 mg 8 mg  9/15 7 8  mg 4 mg 8 mg  9/16 8 8  mg 4 mg 4 mg  9/17 9 8  mg 4 mg   9/18 10 8  mg 4 mg   9/19 11 8  mg    9/20 12 8  mg    9/21 13 4  mg    9/22 14 4  mg     Check fasting blood sugars daily while on the taper. Notify MD if fasting blood sugar>95.  Michelene Heady [facility] 03/28/2012 2:07 PM

## 2012-03-29 LAB — GLUCOSE, CAPILLARY: Glucose-Capillary: 97 mg/dL (ref 70–99)

## 2012-03-29 NOTE — Progress Notes (Signed)
MD  Made aware  That PT  Has been taking IV  Apart  And  Leaving  Unit    There is an order to NSL  Iv  Plan for  Discharge  In am   Pt has let Floor  x2  Today and MD notified and  Unhooked  Iv  Fluids   Past being warned  Not to. Risk has  Been explained to pt .

## 2012-03-29 NOTE — Progress Notes (Signed)
UR Chart review completed.  

## 2012-03-29 NOTE — Progress Notes (Signed)
Patient ID: Amber Fitzpatrick, female   DOB: [birth year], 21 y.o.   MRN: [id number] Vital signs normal Patient note tolerating soft diet Feels much better than on admission

## 2012-03-30 LAB — COMPREHENSIVE METABOLIC PANEL
Albumin: 3.8 g/dL (ref 3.5–5.2)
BUN: 4 mg/dL — ABNORMAL LOW (ref 6–23)
Calcium: 9.9 mg/dL (ref 8.4–10.5)
Creatinine, Ser: 0.53 mg/dL (ref 0.50–1.10)
Total Protein: 7.4 g/dL (ref 6.0–8.3)

## 2012-03-30 LAB — GLUCOSE, CAPILLARY: Glucose-Capillary: 89 mg/dL (ref 70–99)

## 2012-03-30 MED ORDER — CLINDAMYCIN PHOSPHATE 900 MG/50ML IV SOLN
900.0000 mg | Freq: Three times a day (TID) | INTRAVENOUS | Status: DC
  Administered 2012-03-30 – 2012-04-01 (×6): 900 mg via INTRAVENOUS
  Filled 2012-03-30 (×7): qty 50

## 2012-03-30 MED ORDER — ONDANSETRON 8 MG/NS 50 ML IVPB
8.0000 mg | Freq: Four times a day (QID) | INTRAVENOUS | Status: DC
  Administered 2012-03-30 – 2012-04-01 (×8): 8 mg via INTRAVENOUS
  Filled 2012-03-30 (×11): qty 8

## 2012-03-30 MED ORDER — M.V.I. ADULT IV INJ: 10.0000 mL | INTRAVENOUS | Status: DC

## 2012-03-30 MED ORDER — LORAZEPAM 2 MG/ML IJ SOLN
0.5000 mg | Freq: Four times a day (QID) | INTRAMUSCULAR | Status: DC
  Administered 2012-03-30 – 2012-04-01 (×7): 0.5 mg via INTRAVENOUS
  Filled 2012-03-30 (×7): qty 1

## 2012-03-30 MED ORDER — PANTOPRAZOLE SODIUM 40 MG IV SOLR
40.0000 mg | Freq: Two times a day (BID) | INTRAVENOUS | Status: DC
  Administered 2012-03-30 – 2012-03-31 (×4): 40 mg via INTRAVENOUS
  Filled 2012-03-30 (×3): qty 40

## 2012-03-30 MED ORDER — DEXTROSE IN LACTATED RINGERS 5 % IV SOLN
INTRAVENOUS | Status: DC
  Administered 2012-03-30 – 2012-04-01 (×6): via INTRAVENOUS

## 2012-03-30 MED ORDER — PANTOPRAZOLE SODIUM 40 MG PO TBEC
40.0000 mg | DELAYED_RELEASE_TABLET | Freq: Two times a day (BID) | ORAL | Status: DC
  Filled 2012-03-30: qty 1

## 2012-03-30 NOTE — Progress Notes (Signed)
Patient ID: Amber Fitzpatrick, female   DOB: 06/03/1991, 21 y.o.   MRN: [id number] Hospital Day: 3  S: Preterm labor symptoms: N/V  O: Blood pressure 127/85, pulse 79, temperature 98.5 F (36.9 C), temperature source Oral, resp. rate 16, height 5\' 3"  (1.6 m), weight 52.334 kg (115 lb 6 oz), last menstrual period 01/04/2012, SpO2 100.00%.   WNU:UVOZDGUY: 150 bpm Toco: None SVE:   A/P- 21 y.o. admitted with:  Nausea and vomiting, intractable.  Steroids started.  Will start IV Zofran.  Check lytes.  Patient Active Hospital Problem List: No active hospital problems.   Pregnancy Complications: Hyperemesis  Preterm labor management: no treatment necessary Dating:  [redacted]w[redacted]d PNL Needed:  CMET FWB:  good PTL:  stable

## 2012-03-30 NOTE — Progress Notes (Addendum)
The patient is receiving protonix by the intravenous route.  Based on criteria approved by the Pharmacy and Therapeutics Committee and the Medical Executive Committee, the medication is being converted to the equivalent oral dose form.  These criteria include: -No Active GI bleeding -Able to tolerate diet of full liquids (or better) or tube feeding -Able to tolerate other medications by the oral or enteral route  If you have any questions about this conversion, please contact the Pharmacy Department (ext [phone]).  Thank you.   Natasha Bence 03/30/2012   Per RN report, pt is now unable to keep po's down.  Will change back to IV protonix.

## 2012-03-31 LAB — GLUCOSE, CAPILLARY: Glucose-Capillary: 91 mg/dL (ref 70–99)

## 2012-03-31 NOTE — Progress Notes (Signed)
Patient ID: Amber Fitzpatrick, female   DOB: 11-Oct-1990, 21 y.o.   MRN: [id number] Hospital Day: 4  S: Preterm labor symptoms: nausea and vomiting  O: Blood pressure 116/76, pulse 61, temperature 99.5 F (37.5 C), temperature source Oral, resp. rate 18, height 5\' 3"  (1.6 m), weight 52.504 kg (115 lb 12 oz), last menstrual period 01/04/2012, SpO2 100.00%.   WUJ:WJXBJYNW: 150 bpm Toco: None SVE:  omitted  A/P- 21 y.o. admitted with:  N/V.  Antiemetic therapy started.  Stable.  Continue supportive management.  Patient Active Hospital Problem List: No active hospital problems.   Pregnancy Complications: Hyperemesis  Preterm labor management: no treatment necessary Dating:  [redacted]w[redacted]d PNL Needed:  none FWB:  good PTL:  none

## 2012-04-01 DIAGNOSIS — O211 Hyperemesis gravidarum with metabolic disturbance: Secondary | ICD-10-CM | POA: Diagnosis present

## 2012-04-01 LAB — GLUCOSE, CAPILLARY: Glucose-Capillary: 93 mg/dL (ref 70–99)

## 2012-04-01 LAB — BASIC METABOLIC PANEL
Calcium: 9.8 mg/dL (ref 8.4–10.5)
GFR calc Af Amer: >90 mL/min (ref >90)
GFR calc non Af Amer: >90 mL/min (ref >90)
Glucose, Bld: 96 mg/dL (ref 70–99)
Sodium: 134 mEq/L — ABNORMAL LOW (ref 135–145)

## 2012-04-01 LAB — PHOSPHORUS: Phosphorus: 3.5 mg/dL (ref 2.3–4.6)

## 2012-04-01 LAB — MAGNESIUM: Magnesium: 1.8 mg/dL (ref 1.5–2.5)

## 2012-04-01 MED ORDER — POTASSIUM CHLORIDE ER 10 MEQ PO TBCR: 20.0000 meq | EXTENDED_RELEASE_TABLET | Freq: Two times a day (BID) | ORAL | Status: DC

## 2012-04-01 MED ORDER — POTASSIUM CHLORIDE CRYS ER 20 MEQ PO TBCR
20.0000 meq | EXTENDED_RELEASE_TABLET | Freq: Two times a day (BID) | ORAL | Status: DC
  Administered 2012-04-01: 20 meq via ORAL
  Filled 2012-04-01 (×3): qty 1

## 2012-04-01 MED ORDER — PANTOPRAZOLE SODIUM 40 MG PO TBEC
40.0000 mg | DELAYED_RELEASE_TABLET | Freq: Two times a day (BID) | ORAL | Status: DC
  Filled 2012-04-01 (×2): qty 1

## 2012-04-01 MED ORDER — CLINDAMYCIN HCL 300 MG PO CAPS
300.0000 mg | ORAL_CAPSULE | Freq: Three times a day (TID) | ORAL | Status: DC
  Administered 2012-04-01: 300 mg via ORAL
  Filled 2012-04-01 (×5): qty 1

## 2012-04-01 NOTE — Progress Notes (Signed)
Patient ID: Amber Fitzpatrick, female   DOB: April 10, 1991, 21 y.o.   MRN: [id number] Hospital Day: 5  S: No N/V  O: Blood pressure 116/71, pulse 72, temperature 98.9 F (37.2 C), temperature source Oral, resp. rate 16, height 5\' 3"  (1.6 m), weight 52.957 kg (116 lb 12 oz), last menstrual period 01/04/2012, SpO2 97.00%.   FHT:OK by doppler  A/P- 21 y.o. admitted with:  Patient Active Hospital Problem List: Hyperemesis gravidarum with electrolyte imbalance (04/01/2012)   Assessment: Tolerating po   Plan: Check electrolytes/discharge planning ?MRSA infection Social Services consult   Pregnancy Complications: see above  Dating:  [redacted]w[redacted]d

## 2012-04-01 NOTE — Progress Notes (Signed)
UR Chart review completed.  

## 2012-04-01 NOTE — Progress Notes (Addendum)
The patient is receiving protonix by the intravenous route.  Based on criteria approved by the Pharmacy and Therapeutics Committee and the Medical Executive Committee, the medication is being converted to the equivalent oral dose form.  These criteria include: -No Active GI bleeding -Able to tolerate diet of full liquids (or better) or tube feeding -Able to tolerate other medications by the oral or enteral route  If you have any questions about this conversion, please contact the Pharmacy Department (ext [phone]).  Thank you.   Natasha Bence 04/01/2012

## 2012-04-01 NOTE — Progress Notes (Signed)
SW received consult for multiple social stressors.  SW met with patient and identifies no barriers to d/c.  SW to provide full assessment documentation at a later time.

## 2012-04-01 NOTE — Discharge Summary (Signed)
Physician Discharge Summary  Patient ID: BERLIE PERSKY MRN: [id number] DOB/AGE: 12-17-1990 21 y.o.  Admit date: 03/28/2012 Discharge date: 04/01/2012  Admission Diagnoses: Hyperemesis Gravidarum  Discharge Diagnoses:  Same, Hypokalemia                                                                                                                                                                                                                                                                                                                                                                                                           Active Problems:  Hyperemesis gravidarum with electrolyte imbalance   Discharged Condition: good  Hospital Course: Admitted with intractable N/V.  Responded well to antiemetic therapy.  Discharged home tolerating regular diet.  Consults: None  Significant Diagnostic Studies: labs: CMET  Treatments: IV hydration, steroids: prednisone and Zofran.  Discharge Exam: Blood pressure 138/81, pulse 93, temperature 98.2 F (36.8 C), temperature source Oral, resp. rate 16, height 5\' 3"  (1.6 m), weight 52.957 kg (116 lb 12 oz), last menstrual period 01/04/2012, SpO2 98.00%. General appearance: alert and no distress GI: soft, non-tender; bowel sounds normal; no masses,  no organomegaly Extremities: extremities normal, atraumatic, no cyanosis or edema  Disposition: 01-Home or Self Care  Discharge Orders    Future Orders Please Complete By Expires   Discharge instructions      Comments:   Routine for hyperemesis.   Discharge activity:  No Restrictions      Discharge diet:      No sexual activity restrictions      Discharge  patient          Medication List     As of 04/01/2012  4:01 PM    STOP taking these medications         metroNIDAZOLE 500 MG tablet   Commonly known as: FLAGYL      TAKE these medications         ONE-A-DAY WOMENS VITACRAVES Chew   Chew 1 tablet by mouth daily.      potassium chloride 10 MEQ tablet   Commonly known as: K-DUR   Take 2 tablets (20 mEq total) by mouth 2 (two) times daily.           Follow-up Information    Follow up with MARSHALL,BERNARD A, MD. Schedule an appointment as soon as possible for a visit in 1 week.   Contact information:   [address] [place] [address] [phone]          Signed: HARPER,CHARLES A 04/01/2012, 4:01 PM

## 2012-04-01 NOTE — Progress Notes (Signed)
Patient states she is very anxious and really wants to get home today.  States she feels better and will rest better at home. Pt has not had any n/v today, pt ambulating in hallways. Will cont to monitor anxiety.

## 2012-04-01 NOTE — Progress Notes (Signed)
Patient ID: Amber Fitzpatrick, female   DOB: 06/12/1991, 21 y.o.   MRN: [id number] Hospital Day: 5  S: Preterm labor symptoms: nausea and vomiting  O: Blood pressure 138/81, pulse 93, temperature 98.2 F (36.8 C), temperature source Oral, resp. rate 16, height 5\' 3"  (1.6 m), weight 52.957 kg (116 lb 12 oz), last menstrual period 01/04/2012, SpO2 98.00%.   WUJ:WJXBJYNW: 150 bpm Toco: None SVE:   A/P- 21 y.o. admitted with: Nausea and vomiting.    Pregnancy Complications: Hyperemesis Preterm labor management: no treatment necessary Dating:  [redacted]w[redacted]d PNL Needed:  K+ FWB:  good PTL:  none
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
## 2012-05-19 ENCOUNTER — Other Ambulatory Visit (HOSPITAL_COMMUNITY): Payer: Self-pay | Admitting: Obstetrics

## 2012-05-19 DIAGNOSIS — Z0489 Encounter for examination and observation for other specified reasons: Secondary | ICD-10-CM

## 2012-05-23 ENCOUNTER — Ambulatory Visit (HOSPITAL_COMMUNITY)
Admission: RE | Admit: 2012-05-23 | Discharge: 2012-05-23 | Disposition: A | Discharge status: Home / Self Care | Payer: 59 | Source: Ambulatory Visit | Attending: Obstetrics | Admitting: Obstetrics

## 2012-05-23 ENCOUNTER — Other Ambulatory Visit (HOSPITAL_COMMUNITY): Payer: Self-pay | Admitting: Obstetrics

## 2012-05-23 DIAGNOSIS — Z0489 Encounter for examination and observation for other specified reasons: Secondary | ICD-10-CM

## 2012-05-23 DIAGNOSIS — Z3689 Encounter for other specified antenatal screening: Secondary | ICD-10-CM | POA: Insufficient documentation

## 2012-05-23 LAB — OB RESULTS CONSOLE RPR: RPR: NONREACTIVE

## 2012-05-23 LAB — OB RESULTS CONSOLE RUBELLA ANTIBODY, IGM: Rubella: IMMUNE

## 2012-05-23 LAB — OB RESULTS CONSOLE ANTIBODY SCREEN: Antibody Screen: NEGATIVE

## 2012-06-03 ENCOUNTER — Encounter (HOSPITAL_COMMUNITY): Payer: Self-pay | Admitting: Gynecology

## 2012-07-02 ENCOUNTER — Inpatient Hospital Stay (HOSPITAL_COMMUNITY)
Admission: AD | Admit: 2012-07-02 | Discharge: 2012-07-02 | Disposition: A | Discharge status: Home / Self Care | Payer: 59 | Source: Ambulatory Visit | Attending: Obstetrics | Admitting: Obstetrics

## 2012-07-02 DIAGNOSIS — K649 Unspecified hemorrhoids: Secondary | ICD-10-CM | POA: Insufficient documentation

## 2012-07-02 NOTE — ED Notes (Signed)
Pt unable to stay stated she had and emergency and had to leave during triage

## 2012-07-02 NOTE — MAU Note (Signed)
Pt reports "having Meat hanging out of her butt". Started 2 days ago. Hurts when she coughs and had a BM

## 2012-07-06 ENCOUNTER — Encounter (HOSPITAL_COMMUNITY): Payer: Self-pay | Admitting: *Deleted

## 2012-07-06 ENCOUNTER — Inpatient Hospital Stay (HOSPITAL_COMMUNITY)
Admission: AD | Admit: 2012-07-06 | Discharge: 2012-07-06 | Disposition: A | Discharge status: Home / Self Care | Payer: 59 | Source: Ambulatory Visit | Attending: Obstetrics | Admitting: Obstetrics

## 2012-07-06 DIAGNOSIS — R109 Unspecified abdominal pain: Secondary | ICD-10-CM | POA: Insufficient documentation

## 2012-07-06 DIAGNOSIS — O99891 Other specified diseases and conditions complicating pregnancy: Secondary | ICD-10-CM | POA: Insufficient documentation

## 2012-07-06 DIAGNOSIS — K5900 Constipation, unspecified: Secondary | ICD-10-CM

## 2012-07-06 DIAGNOSIS — O228X9 Other venous complications in pregnancy, unspecified trimester: Secondary | ICD-10-CM | POA: Insufficient documentation

## 2012-07-06 DIAGNOSIS — O2240 Hemorrhoids in pregnancy, unspecified trimester: Secondary | ICD-10-CM | POA: Diagnosis present

## 2012-07-06 DIAGNOSIS — N949 Unspecified condition associated with female genital organs and menstrual cycle: Secondary | ICD-10-CM | POA: Diagnosis present

## 2012-07-06 DIAGNOSIS — K649 Unspecified hemorrhoids: Secondary | ICD-10-CM | POA: Insufficient documentation

## 2012-07-06 LAB — URINE MICROSCOPIC-ADD ON

## 2012-07-06 LAB — URINALYSIS, ROUTINE W REFLEX MICROSCOPIC
Glucose, UA: NEGATIVE mg/dL
Hgb urine dipstick: NEGATIVE
Ketones, ur: NEGATIVE mg/dL
pH: 7 (ref 5.0–8.0)

## 2012-07-06 MED ORDER — DOCUSATE SODIUM 100 MG PO CAPS
100.0000 mg | ORAL_CAPSULE | ORAL | Status: AC
  Administered 2012-07-06: 100 mg via ORAL
  Filled 2012-07-06: qty 1

## 2012-07-06 MED ORDER — DSS 100 MG PO CAPS: 100.0000 mg | ORAL_CAPSULE | Freq: Two times a day (BID) | ORAL | Status: DC | PRN

## 2012-07-06 MED ORDER — POLYETHYLENE GLYCOL 3350 17 G PO PACK: 17.0000 g | PACK | Freq: Every day | ORAL | Status: DC | PRN

## 2012-07-06 NOTE — MAU Provider Note (Signed)
Chief Complaint:  Rectal Bleeding and Abdominal Cramping   First Provider Initiated Contact with Patient 07/06/12 1624      HPI: Amber Fitzpatrick is a 21 y.o. G2P0010 at [redacted]w[redacted]d who presents to maternity admissions reporting inguinal sharp intermittent pain with movement and small amount of rectal bleeding when she wipes.  Last BM 3 days ago which was hard and painful.  She reports good fetal movement, denies LOF, vaginal bleeding, vaginal itching/burning, urinary symptoms, h/a, dizziness, n/v, or fever/chills.     Past Medical History: Past Medical History  Diagnosis Date  . Asthma   . Anemia   . MRSA (methicillin resistant Staphylococcus aureus)   . Anxiety   . Mental disorder   . Abnormal Pap smear     f/u ok  . Infection     Past obstetric history: OB History    Grav Para Term Preterm Abortions TAB SAB Ect Mult Living   2    1 1     0     # Outc Date GA Lbr Len/2nd Wgt Sex Del Anes PTL Lv   1 TAB            2 CUR               Past Surgical History: Past Surgical History  Procedure Date  . No past surgeries     Family History: Family History  Problem Relation Age of Onset  . Anesthesia problems Neg Hx   . Other Neg Hx   . Hypertension Mother   . Hyperlipidemia Mother   . Hearing loss Father     Social History: History  Substance Use Topics  . Smoking status: Former Games developer  . Smokeless tobacco: Never Used  . Alcohol Use: No     Comment: marijuana bid    Allergies:  Allergies  Allergen Reactions  . Sulfa Antibiotics Rash    Meds:  Prescriptions prior to admission  Medication Sig Dispense Refill  . albuterol (PROVENTIL HFA;VENTOLIN HFA) 108 (90 BASE) MCG/ACT inhaler Inhale 2 puffs into the lungs every 6 (six) hours as needed.      . Multiple Vitamins-Minerals (ONE-A-DAY WOMENS VITACRAVES) CHEW Chew 1 tablet by mouth daily.      . [DISCONTINUED] potassium chloride (K-DUR) 10 MEQ tablet Take 2 tablets (20 mEq total) by mouth 2 (two) times daily.  8  tablet  0    ROS: Pertinent findings in history of present illness.  Physical Exam  Blood pressure 118/81, temperature 98.1 F (36.7 C), temperature source Oral, resp. rate 20, last menstrual period 01/04/2012. GENERAL: Well-developed, well-nourished female in no acute distress.  HEENT: normocephalic HEART: normal rate RESP: normal effort ABDOMEN: Soft, non-tender, gravid appropriate for gestational age EXTREMITIES: Nontender, no edema NEURO: alert and oriented SPECULUM EXAM: Deferred    FHT:  Baseline 140 , moderate variability, accelerations present (10x10), no decelerations Contractions: None   Labs: Results for orders placed during the hospital encounter of 07/06/12 (from the past 24 hour(s))  URINALYSIS, ROUTINE W REFLEX MICROSCOPIC     Status: Abnormal   Collection Time   07/06/12  3:35 PM      Component Value Range   Color, Urine YELLOW  YELLOW   APPearance CLOUDY (*) CLEAR   Specific Gravity, Urine 1.020  1.005 - 1.030   pH 7.0  5.0 - 8.0   Glucose, UA NEGATIVE  NEGATIVE mg/dL   Hgb urine dipstick NEGATIVE  NEGATIVE   Bilirubin Urine NEGATIVE  NEGATIVE   Ketones,  ur NEGATIVE  NEGATIVE mg/dL   Protein, ur NEGATIVE  NEGATIVE mg/dL   Urobilinogen, UA 0.2  0.0 - 1.0 mg/dL   Nitrite NEGATIVE  NEGATIVE   Leukocytes, UA TRACE (*) NEGATIVE  URINE MICROSCOPIC-ADD ON     Status: Abnormal   Collection Time   07/06/12  3:35 PM      Component Value Range   Squamous Epithelial / LPF MANY (*) RARE   WBC, UA 3-6  <3 WBC/hpf   RBC / HPF 0-2  <3 RBC/hpf   Bacteria, UA RARE  RARE   Urine-Other MUCOUS PRESENT      Assessment: 1. Constipation in pregnancy   2. Hemorrhoids in pregnancy     Plan: Colace x1 dose in MAU Discharge home Colace BID and Mirilax daily sent to pt pharmacy F/U as scheduled tomorrow with Dr Gaynell Face Return to MAU as needed  Follow-up Information    Follow up with Kathreen Cosier, MD.   Contact information:   [address] [place] [address] [phone]           Medication List     As of 07/06/2012  4:42 PM    TAKE these medications         albuterol 108 (90 BASE) MCG/ACT inhaler   Commonly known as: PROVENTIL HFA;VENTOLIN HFA   Inhale 2 puffs into the lungs every 6 (six) hours as needed.      DSS 100 MG Caps   Take 100 mg by mouth 2 (two) times daily as needed for constipation.      polyethylene glycol packet   Commonly known as: MIRALAX / GLYCOLAX   Take 17 g by mouth daily as needed.      ASK your doctor about these medications         ONE-A-DAY WOMENS VITACRAVES Chew   Chew 1 tablet by mouth daily.      potassium chloride 10 MEQ tablet   Commonly known as: K-DUR   Take 2 tablets (20 mEq total) by mouth 2 (two) times daily.        Sharen Counter Certified Nurse-Midwife 07/06/2012 4:42 PM

## 2012-07-06 NOTE — MAU Note (Signed)
Pt C/O rectal bleeding for past 3 days.  "I have a bubble on my butt."   Pt C/O cramping in lower abd & sides for the past week.  Denies bleeding or LOF.

## 2012-07-20 NOTE — L&D Delivery Note (Signed)
Delivery Note At 12:29 AM a viable female was delivered via Vaginal, Spontaneous Delivery (Presentation: Left Occiput Anterior).  APGAR: 8, 9.   Placenta status: Intact, Manual removal Pathology.  Cord: 3 vessels with the following complications: None.    Anesthesia: Epidural  Episiotomy: None Lacerations: 2nd degree;Perineal Suture Repair: 2.0 3.0 vicryl rapide Est. Blood Loss (mL): 250 ml  Mom to postpartum.  Baby to nursery-stable.  JACKSON-MOORE,LISA A 09/27/2012, 1:01 AM

## 2012-08-01 ENCOUNTER — Other Ambulatory Visit (HOSPITAL_COMMUNITY): Payer: Self-pay | Admitting: Obstetrics

## 2012-08-01 DIAGNOSIS — IMO0002 Reserved for concepts with insufficient information to code with codable children: Secondary | ICD-10-CM

## 2012-08-05 ENCOUNTER — Other Ambulatory Visit (HOSPITAL_COMMUNITY): Payer: Self-pay | Admitting: Obstetrics

## 2012-08-05 ENCOUNTER — Ambulatory Visit (HOSPITAL_COMMUNITY)
Admission: RE | Admit: 2012-08-05 | Discharge: 2012-08-05 | Disposition: A | Discharge status: Home / Self Care | Payer: Medicaid Other | Source: Ambulatory Visit | Attending: Obstetrics | Admitting: Obstetrics

## 2012-08-05 ENCOUNTER — Encounter (HOSPITAL_COMMUNITY): Payer: Self-pay | Admitting: *Deleted

## 2012-08-05 ENCOUNTER — Inpatient Hospital Stay (HOSPITAL_COMMUNITY)
Admission: AD | Admit: 2012-08-05 | Discharge: 2012-08-05 | Disposition: A | Discharge status: Home / Self Care | Payer: Medicaid Other | Source: Ambulatory Visit | Attending: Obstetrics | Admitting: Obstetrics

## 2012-08-05 DIAGNOSIS — O26879 Cervical shortening, unspecified trimester: Secondary | ICD-10-CM | POA: Insufficient documentation

## 2012-08-05 DIAGNOSIS — IMO0002 Reserved for concepts with insufficient information to code with codable children: Secondary | ICD-10-CM

## 2012-08-05 NOTE — MAU Note (Signed)
Pt was sent over from physicians office to evaluate for contractions, states her ultrasound showed a short cervix

## 2012-08-05 NOTE — MAU Provider Note (Signed)
Chief Complaint:  monitor for contractions    First Provider Initiated Contact with Patient 08/05/12 1807      HPI: Amber Fitzpatrick is a 22 y.o. G2P0010 at 46w4dwho presents to maternity admissions from scheduled U/S today with findings of shortened cervix.  Dr Tamela Oddi called from U/S and pt asked to come to MAU to evaluate for contractions on monitor.  She reports good fetal movement, denies feeling cramping/contractions, LOF, vaginal bleeding, vaginal itching/burning, urinary symptoms, h/a, dizziness, n/v, or fever/chills.     Past Medical History: Past Medical History  Diagnosis Date  . Asthma   . Anemia   . MRSA (methicillin resistant Staphylococcus aureus)   . Anxiety   . Mental disorder   . Abnormal Pap smear     f/u ok  . Infection     Past obstetric history: OB History    Grav Para Term Preterm Abortions TAB SAB Ect Mult Living   2    1 1     0     # Outc Date GA Lbr Len/2nd Wgt Sex Del Anes PTL Lv   1 TAB            2 CUR               Past Surgical History: Past Surgical History  Procedure Date  . No past surgeries     Family History: Family History  Problem Relation Age of Onset  . Anesthesia problems Neg Hx   . Other Neg Hx   . Hypertension Mother   . Hyperlipidemia Mother   . Hearing loss Father     Social History: History  Substance Use Topics  . Smoking status: Former Games developer  . Smokeless tobacco: Never Used  . Alcohol Use: No     Comment: marijuana bid    Allergies:  Allergies  Allergen Reactions  . Sulfa Antibiotics Rash    Meds:  Prescriptions prior to admission  Medication Sig Dispense Refill  . albuterol (PROVENTIL HFA;VENTOLIN HFA) 108 (90 BASE) MCG/ACT inhaler Inhale 2 puffs into the lungs every 6 (six) hours as needed.      . docusate sodium 100 MG CAPS Take 100 mg by mouth 2 (two) times daily as needed for constipation.  60 capsule  3  . Multiple Vitamins-Minerals (ONE-A-DAY WOMENS VITACRAVES) CHEW Chew 1 tablet by  mouth daily.      . ondansetron (ZOFRAN) 4 MG tablet Take 4 mg by mouth every 8 (eight) hours as needed. nausea      . polyethylene glycol (MIRALAX / GLYCOLAX) packet Take 17 g by mouth daily as needed.  14 each  0  . promethazine (PHENERGAN) 25 MG tablet Take 25 mg by mouth every 6 (six) hours as needed. nausea        ROS: Pertinent findings in history of present illness.  Physical Exam  Blood pressure 108/73, pulse 76, temperature 98.3 F (36.8 C), temperature source Oral, resp. rate 18, height 5\' 3"  (1.6 m), weight 54.341 kg (119 lb 12.8 oz), last menstrual period 01/04/2012. GENERAL: Well-developed, well-nourished female in no acute distress.  HEENT: normocephalic HEART: normal rate RESP: normal effort ABDOMEN: Soft, non-tender, gravid appropriate for gestational age EXTREMITIES: Nontender, no edema NEURO: alert and oriented SPECULUM EXAM: Deferred     FHR baseline 135 with 15x15 accels, no decels, moderate variability One contractions on monitor in 30 minutes, palpated mild, pt denies feeling ctx    Imaging:     Assessment: 1. Cervical shortening  Plan: Called Dr Tamela Oddi to discuss U/S findings and EFM/toco monitor results Discharge home PTL precautions Pelvic rest Continue bed rest as prescribed F/U with Dr Gaynell Face next week Return to MAU as needed  Follow-up Information    Call Kathreen Cosier, MD. (Call Dr Gaynell Face on Tuesday to make prenatal appointment.  Return to MAU as needed.)    Contact information:   [address] [place] [address] [phone]           Medication List     As of 08/05/2012  6:22 PM    TAKE these medications         albuterol 108 (90 BASE) MCG/ACT inhaler   Commonly known as: PROVENTIL HFA;VENTOLIN HFA   Inhale 2 puffs into the lungs every 6 (six) hours as needed.      DSS 100 MG Caps   Take 100 mg by mouth 2 (two) times daily as needed for constipation.      ondansetron 4 MG tablet    Commonly known as: ZOFRAN   Take 4 mg by mouth every 8 (eight) hours as needed. nausea      ONE-A-DAY WOMENS VITACRAVES Chew   Chew 1 tablet by mouth daily.      polyethylene glycol packet   Commonly known as: MIRALAX / GLYCOLAX   Take 17 g by mouth daily as needed.      promethazine 25 MG tablet   Commonly known as: PHENERGAN   Take 25 mg by mouth every 6 (six) hours as needed. nausea        Sharen Counter Certified Nurse-Midwife 08/05/2012 6:22 PM

## 2012-09-12 ENCOUNTER — Other Ambulatory Visit (HOSPITAL_COMMUNITY): Payer: Self-pay | Admitting: Obstetrics

## 2012-09-12 DIAGNOSIS — Z364 Encounter for antenatal screening for fetal growth retardation: Secondary | ICD-10-CM

## 2012-09-14 ENCOUNTER — Ambulatory Visit (HOSPITAL_COMMUNITY): Payer: Medicaid Other

## 2012-09-15 ENCOUNTER — Ambulatory Visit (HOSPITAL_COMMUNITY)
Admission: RE | Admit: 2012-09-15 | Discharge: 2012-09-15 | Disposition: A | Discharge status: Home / Self Care | Payer: Medicaid Other | Source: Ambulatory Visit | Attending: Obstetrics | Admitting: Obstetrics

## 2012-09-15 ENCOUNTER — Other Ambulatory Visit (HOSPITAL_COMMUNITY): Payer: Self-pay | Admitting: Obstetrics

## 2012-09-15 DIAGNOSIS — O365990 Maternal care for other known or suspected poor fetal growth, unspecified trimester, not applicable or unspecified: Secondary | ICD-10-CM | POA: Insufficient documentation

## 2012-09-15 DIAGNOSIS — Z364 Encounter for antenatal screening for fetal growth retardation: Secondary | ICD-10-CM

## 2012-09-15 DIAGNOSIS — Z3689 Encounter for other specified antenatal screening: Secondary | ICD-10-CM | POA: Insufficient documentation

## 2012-09-23 ENCOUNTER — Other Ambulatory Visit (HOSPITAL_COMMUNITY): Payer: Self-pay | Admitting: Obstetrics

## 2012-09-23 ENCOUNTER — Ambulatory Visit (HOSPITAL_COMMUNITY): Admission: RE | Admit: 2012-09-23 | Payer: Medicaid Other | Source: Ambulatory Visit

## 2012-09-23 ENCOUNTER — Ambulatory Visit (HOSPITAL_COMMUNITY)
Admission: RE | Admit: 2012-09-23 | Discharge: 2012-09-23 | Disposition: A | Discharge status: Home / Self Care | Payer: Medicaid Other | Source: Ambulatory Visit | Attending: Obstetrics | Admitting: Obstetrics

## 2012-09-23 VITALS — BP 124/82 | HR 66 | Wt 126.0 lb

## 2012-09-23 DIAGNOSIS — O365990 Maternal care for other known or suspected poor fetal growth, unspecified trimester, not applicable or unspecified: Secondary | ICD-10-CM | POA: Insufficient documentation

## 2012-09-23 DIAGNOSIS — IMO0002 Reserved for concepts with insufficient information to code with codable children: Secondary | ICD-10-CM

## 2012-09-24 ENCOUNTER — Encounter (HOSPITAL_COMMUNITY): Payer: Self-pay | Admitting: Family

## 2012-09-24 ENCOUNTER — Inpatient Hospital Stay (HOSPITAL_COMMUNITY)
Admission: AD | Admit: 2012-09-24 | Discharge: 2012-09-24 | Disposition: A | Discharge status: Home / Self Care | Payer: Medicaid Other | Source: Ambulatory Visit | Attending: Obstetrics | Admitting: Obstetrics

## 2012-09-24 DIAGNOSIS — O99891 Other specified diseases and conditions complicating pregnancy: Secondary | ICD-10-CM | POA: Insufficient documentation

## 2012-09-24 DIAGNOSIS — O368190 Decreased fetal movements, unspecified trimester, not applicable or unspecified: Secondary | ICD-10-CM | POA: Insufficient documentation

## 2012-09-24 DIAGNOSIS — R109 Unspecified abdominal pain: Secondary | ICD-10-CM | POA: Insufficient documentation

## 2012-09-24 HISTORY — DX: Trichomoniasis, unspecified: A59.9

## 2012-09-24 LAB — POCT FERN TEST: POCT Fern Test: NEGATIVE

## 2012-09-24 NOTE — MAU Note (Signed)
Pt presents with complaints of leakage of fluid that was clear since last night. States some cramping throughout the night with a decrease in fetal movement

## 2012-09-24 NOTE — MAU Note (Signed)
Patient presents to MAu with c/o leaking clear fluid since 0200 today. Denies vaginal bleeding; reports fetal movement.

## 2012-09-26 ENCOUNTER — Encounter (HOSPITAL_COMMUNITY): Payer: Self-pay

## 2012-09-26 ENCOUNTER — Encounter (HOSPITAL_COMMUNITY): Payer: Self-pay | Admitting: Anesthesiology

## 2012-09-26 ENCOUNTER — Inpatient Hospital Stay (HOSPITAL_COMMUNITY)
Admission: AD | Admit: 2012-09-26 | Discharge: 2012-09-29 | DRG: 774 | Disposition: A | Discharge status: Home / Self Care | Payer: Medicaid Other | Source: Ambulatory Visit | Attending: Obstetrics | Admitting: Obstetrics

## 2012-09-26 ENCOUNTER — Inpatient Hospital Stay (HOSPITAL_COMMUNITY): Payer: Medicaid Other | Admitting: Anesthesiology

## 2012-09-26 DIAGNOSIS — O365990 Maternal care for other known or suspected poor fetal growth, unspecified trimester, not applicable or unspecified: Principal | ICD-10-CM | POA: Diagnosis present

## 2012-09-26 LAB — CBC
MCH: 28.8 pg (ref 26.0–34.0)
MCV: 87.2 fL (ref 78.0–100.0)
Platelets: 264 10*3/uL (ref 150–400)
RDW: 15.1 % (ref 11.5–15.5)

## 2012-09-26 MED ORDER — OXYTOCIN 40 UNITS IN LACTATED RINGERS INFUSION - SIMPLE MED
62.5000 mL/h | INTRAVENOUS | Status: DC
  Administered 2012-09-27: 500 mL/h via INTRAVENOUS

## 2012-09-26 MED ORDER — IBUPROFEN 600 MG PO TABS: 600.0000 mg | ORAL_TABLET | Freq: Four times a day (QID) | ORAL | Status: DC | PRN

## 2012-09-26 MED ORDER — EPHEDRINE 5 MG/ML INJ
10.0000 mg | INTRAVENOUS | Status: DC | PRN
  Filled 2012-09-26: qty 4

## 2012-09-26 MED ORDER — TERBUTALINE SULFATE 1 MG/ML IJ SOLN: 0.2500 mg | Freq: Once | INTRAMUSCULAR | Status: AC | PRN

## 2012-09-26 MED ORDER — FENTANYL 2.5 MCG/ML BUPIVACAINE 1/10 % EPIDURAL INFUSION (WH - ANES)
14.0000 mL/h | INTRAMUSCULAR | Status: DC | PRN
  Filled 2012-09-26: qty 125

## 2012-09-26 MED ORDER — BUTORPHANOL TARTRATE 1 MG/ML IJ SOLN
1.0000 mg | INTRAMUSCULAR | Status: DC | PRN
  Administered 2012-09-26: 1 mg via INTRAVENOUS
  Filled 2012-09-26: qty 1

## 2012-09-26 MED ORDER — OXYTOCIN BOLUS FROM INFUSION: 500.0000 mL | INTRAVENOUS | Status: DC

## 2012-09-26 MED ORDER — LACTATED RINGERS IV SOLN
INTRAVENOUS | Status: DC
  Administered 2012-09-26: 13:00:00 via INTRAVENOUS

## 2012-09-26 MED ORDER — ACETAMINOPHEN 325 MG PO TABS: 650.0000 mg | ORAL_TABLET | ORAL | Status: DC | PRN

## 2012-09-26 MED ORDER — PROMETHAZINE HCL 25 MG/ML IJ SOLN
12.5000 mg | Freq: Once | INTRAMUSCULAR | Status: AC
  Administered 2012-09-26: 12.5 mg via INTRAVENOUS
  Filled 2012-09-26: qty 1

## 2012-09-26 MED ORDER — LIDOCAINE HCL (PF) 1 % IJ SOLN
INTRAMUSCULAR | Status: DC | PRN
  Administered 2012-09-26: 4 mL
  Administered 2012-09-26: 3 mL

## 2012-09-26 MED ORDER — PHENYLEPHRINE 40 MCG/ML (10ML) SYRINGE FOR IV PUSH (FOR BLOOD PRESSURE SUPPORT)
80.0000 ug | PREFILLED_SYRINGE | INTRAVENOUS | Status: DC | PRN
  Filled 2012-09-26: qty 5

## 2012-09-26 MED ORDER — CITRIC ACID-SODIUM CITRATE 334-500 MG/5ML PO SOLN: 30.0000 mL | ORAL | Status: DC | PRN

## 2012-09-26 MED ORDER — EPHEDRINE 5 MG/ML INJ: 10.0000 mg | INTRAVENOUS | Status: DC | PRN

## 2012-09-26 MED ORDER — LIDOCAINE HCL (PF) 1 % IJ SOLN
30.0000 mL | INTRAMUSCULAR | Status: AC | PRN
  Administered 2012-09-27: 30 mL via SUBCUTANEOUS
  Filled 2012-09-26 (×2): qty 30

## 2012-09-26 MED ORDER — FENTANYL 2.5 MCG/ML BUPIVACAINE 1/10 % EPIDURAL INFUSION (WH - ANES): 14.0000 mL/h | INTRAMUSCULAR | Status: DC | PRN

## 2012-09-26 MED ORDER — PHENYLEPHRINE 40 MCG/ML (10ML) SYRINGE FOR IV PUSH (FOR BLOOD PRESSURE SUPPORT): 80.0000 ug | PREFILLED_SYRINGE | INTRAVENOUS | Status: DC | PRN

## 2012-09-26 MED ORDER — LACTATED RINGERS IV SOLN
500.0000 mL | Freq: Once | INTRAVENOUS | Status: AC
  Administered 2012-09-26: 500 mL via INTRAVENOUS

## 2012-09-26 MED ORDER — LACTATED RINGERS IV SOLN: 500.0000 mL | INTRAVENOUS | Status: DC | PRN

## 2012-09-26 MED ORDER — OXYCODONE-ACETAMINOPHEN 5-325 MG PO TABS: 1.0000 | ORAL_TABLET | ORAL | Status: DC | PRN

## 2012-09-26 MED ORDER — ONDANSETRON HCL 4 MG/2ML IJ SOLN
4.0000 mg | Freq: Four times a day (QID) | INTRAMUSCULAR | Status: DC | PRN
  Administered 2012-09-26 (×2): 4 mg via INTRAVENOUS
  Filled 2012-09-26 (×2): qty 2

## 2012-09-26 MED ORDER — FENTANYL 2.5 MCG/ML BUPIVACAINE 1/10 % EPIDURAL INFUSION (WH - ANES)
INTRAMUSCULAR | Status: DC | PRN
  Administered 2012-09-26: 14 mL/h via EPIDURAL

## 2012-09-26 MED ORDER — DIPHENHYDRAMINE HCL 50 MG/ML IJ SOLN: 12.5000 mg | INTRAMUSCULAR | Status: DC | PRN

## 2012-09-26 MED ORDER — OXYTOCIN 40 UNITS IN LACTATED RINGERS INFUSION - SIMPLE MED
1.0000 m[IU]/min | INTRAVENOUS | Status: DC
  Administered 2012-09-26: 2 m[IU]/min via INTRAVENOUS
  Filled 2012-09-26: qty 1000

## 2012-09-26 NOTE — Anesthesia Procedure Notes (Signed)
Epidural Patient location during procedure: OB Start time: 09/26/2012 6:37 PM  Staffing Anesthesiologist: FOSTER, MICHAEL A. Performed by: anesthesiologist   Preanesthetic Checklist Completed: patient identified, site marked, surgical consent, pre-op evaluation, timeout performed, IV checked, risks and benefits discussed and monitors and equipment checked  Epidural Patient position: sitting Prep: site prepped and draped and DuraPrep Patient monitoring: continuous pulse ox and blood pressure Approach: midline Injection technique: LOR air  Needle:  Needle type: Tuohy  Needle gauge: 17 G Needle length: 9 cm and 9 Needle insertion depth: 4 cm Catheter type: closed end flexible Catheter size: 19 Gauge Catheter at skin depth: 9 cm Test dose: negative and Other  Assessment Events: blood not aspirated, injection not painful, no injection resistance, negative IV test and no paresthesia  Additional Notes Patient identified. Risks and benefits discussed including failed block, incomplete  Pain control, post dural puncture headache, nerve damage, paralysis, blood pressure Changes, nausea, vomiting, reactions to medications-both toxic and allergic and post Partum back pain. All questions were answered. Patient expressed understanding and wished to proceed. Sterile technique was used throughout procedure. Epidural site was Dressed with sterile barrier dressing. No paresthesias, signs of intravascular injection Or signs of intrathecal spread were encountered.  Patient was more comfortable after the epidural was dosed. Please see RN's note for documentation of vital signs and FHR which are stable.

## 2012-09-26 NOTE — Anesthesia Preprocedure Evaluation (Signed)
Anesthesia Evaluation  Patient identified by MRN, date of birth, ID band Patient awake    Reviewed: Allergy & Precautions, H&P , NPO status , Patient's Chart, lab work & pertinent test results  Airway Mallampati: III TM Distance: >3 FB Neck ROM: full    Dental no notable dental hx. (+) Teeth Intact   Pulmonary neg pulmonary ROS, asthma ,  breath sounds clear to auscultation  Pulmonary exam normal       Cardiovascular negative cardio ROS  Rhythm:regular Rate:Normal     Neuro/Psych PSYCHIATRIC DISORDERS Anxiety negative neurological ROS  negative psych ROS   GI/Hepatic negative GI ROS, Neg liver ROS, GERD-  Medicated and Controlled,  Endo/Other  negative endocrine ROS  Renal/GU negative Renal ROS  negative genitourinary   Musculoskeletal negative musculoskeletal ROS (+)   Abdominal Normal abdominal exam  (+)   Peds  Hematology negative hematology ROS (+)   Anesthesia Other Findings   Reproductive/Obstetrics (+) Pregnancy                           Anesthesia Physical Anesthesia Plan  ASA: II  Anesthesia Plan: Epidural   Post-op Pain Management:    Induction:   Airway Management Planned:   Additional Equipment:   Intra-op Plan:   Post-operative Plan:   Informed Consent: I have reviewed the patients History and Physical, chart, labs and discussed the procedure including the risks, benefits and alternatives for the proposed anesthesia with the patient or authorized representative who has indicated his/her understanding and acceptance.     Plan Discussed with: Anesthesiologist  Anesthesia Plan Comments:         Anesthesia Quick Evaluation

## 2012-09-26 NOTE — MAU Note (Signed)
Around one hour ago clear fluid gushed out, cramping.

## 2012-09-26 NOTE — Progress Notes (Signed)
Dr Tamela Oddi informed of sve, will head in house and to not push till she gets here.

## 2012-09-26 NOTE — H&P (Signed)
This is Dr. Francoise Ceo dictating the history and physical on  Amber Fitzpatrick. She is a 22 year old gravida 2 para 0010 at 73 weeks her EDC is 10/10/2012 negative GBS stop patient was seen at MFM on Friday and had an ultrasound that showed developing IUGR she was admitted today with ruptured membranes fluid clear and she is now on low-dose Pitocin cervix 2 cm 90% vertex 0 station. Past medical history negative Past surgical history negative Social history negative System review negative Physical exam well-developed female in early labor HEENT negative Rest negative Lungs clear to P&A Abdomen 38 week size Pelvic as described above Extremities negative

## 2012-09-26 NOTE — MAU Note (Signed)
Notified Dr. Gaynell Face SROM around 11:30 clear fluid cramping cervix loose 1/60/-2 received orders.

## 2012-09-27 ENCOUNTER — Ambulatory Visit (HOSPITAL_COMMUNITY): Payer: Medicaid Other

## 2012-09-27 ENCOUNTER — Encounter (HOSPITAL_COMMUNITY): Payer: Self-pay | Admitting: *Deleted

## 2012-09-27 LAB — HEMOGLOBIN AND HEMATOCRIT, BLOOD
HCT: 25.7 % — ABNORMAL LOW (ref 36.0–46.0)
Hemoglobin: 8.7 g/dL — ABNORMAL LOW (ref 12.0–15.0)

## 2012-09-27 MED ORDER — BENZOCAINE-MENTHOL 20-0.5 % EX AERO
1.0000 "application " | INHALATION_SPRAY | CUTANEOUS | Status: DC | PRN
  Administered 2012-09-27: 1 via TOPICAL
  Filled 2012-09-27: qty 56

## 2012-09-27 MED ORDER — ONDANSETRON HCL 4 MG PO TABS: 4.0000 mg | ORAL_TABLET | ORAL | Status: DC | PRN

## 2012-09-27 MED ORDER — ALBUTEROL SULFATE HFA 108 (90 BASE) MCG/ACT IN AERS
2.0000 | INHALATION_SPRAY | Freq: Four times a day (QID) | RESPIRATORY_TRACT | Status: DC | PRN
  Filled 2012-09-27: qty 6.7

## 2012-09-27 MED ORDER — SENNOSIDES-DOCUSATE SODIUM 8.6-50 MG PO TABS
2.0000 | ORAL_TABLET | Freq: Every day | ORAL | Status: DC
  Administered 2012-09-27 – 2012-09-28 (×2): 2 via ORAL

## 2012-09-27 MED ORDER — CEFAZOLIN SODIUM-DEXTROSE 2-3 GM-% IV SOLR
2.0000 g | Freq: Once | INTRAVENOUS | Status: AC
  Administered 2012-09-27: 2 g via INTRAVENOUS
  Filled 2012-09-27: qty 50

## 2012-09-27 MED ORDER — OXYCODONE-ACETAMINOPHEN 5-325 MG PO TABS
1.0000 | ORAL_TABLET | ORAL | Status: DC | PRN
  Administered 2012-09-27: 2 via ORAL
  Administered 2012-09-27: 1 via ORAL
  Administered 2012-09-27 – 2012-09-29 (×4): 2 via ORAL
  Administered 2012-09-29 (×2): 1 via ORAL
  Filled 2012-09-27: qty 1
  Filled 2012-09-27: qty 2
  Filled 2012-09-27 (×4): qty 1
  Filled 2012-09-27 (×3): qty 2
  Filled 2012-09-27: qty 1

## 2012-09-27 MED ORDER — DIBUCAINE 1 % RE OINT
1.0000 "application " | TOPICAL_OINTMENT | RECTAL | Status: DC | PRN
  Administered 2012-09-27: 1 via RECTAL
  Filled 2012-09-27: qty 28

## 2012-09-27 MED ORDER — MEASLES, MUMPS & RUBELLA VAC ~~LOC~~ INJ
0.5000 mL | INJECTION | Freq: Once | SUBCUTANEOUS | Status: DC
  Filled 2012-09-27: qty 0.5

## 2012-09-27 MED ORDER — FERROUS SULFATE 325 (65 FE) MG PO TABS
325.0000 mg | ORAL_TABLET | Freq: Two times a day (BID) | ORAL | Status: DC
  Administered 2012-09-27 – 2012-09-29 (×5): 325 mg via ORAL
  Filled 2012-09-27 (×5): qty 1

## 2012-09-27 MED ORDER — MAGNESIUM HYDROXIDE 400 MG/5ML PO SUSP: 30.0000 mL | ORAL | Status: DC | PRN

## 2012-09-27 MED ORDER — ONDANSETRON HCL 4 MG/2ML IJ SOLN: 4.0000 mg | INTRAMUSCULAR | Status: DC | PRN

## 2012-09-27 MED ORDER — PRENATAL MULTIVITAMIN CH
1.0000 | ORAL_TABLET | Freq: Every day | ORAL | Status: DC
  Administered 2012-09-27 – 2012-09-28 (×2): 1 via ORAL
  Filled 2012-09-27 (×3): qty 1

## 2012-09-27 MED ORDER — IBUPROFEN 600 MG PO TABS
600.0000 mg | ORAL_TABLET | Freq: Four times a day (QID) | ORAL | Status: DC
  Administered 2012-09-27 – 2012-09-29 (×9): 600 mg via ORAL
  Filled 2012-09-27 (×10): qty 1

## 2012-09-27 MED ORDER — DIPHENHYDRAMINE HCL 25 MG PO CAPS: 25.0000 mg | ORAL_CAPSULE | Freq: Four times a day (QID) | ORAL | Status: DC | PRN

## 2012-09-27 MED ORDER — ZOLPIDEM TARTRATE 5 MG PO TABS: 5.0000 mg | ORAL_TABLET | Freq: Every evening | ORAL | Status: DC | PRN

## 2012-09-27 MED ORDER — INFLUENZA VIRUS VACC SPLIT PF IM SUSP
0.5000 mL | INTRAMUSCULAR | Status: AC
  Administered 2012-09-28: 0.5 mL via INTRAMUSCULAR

## 2012-09-27 MED ORDER — LANOLIN HYDROUS EX OINT: TOPICAL_OINTMENT | CUTANEOUS | Status: DC | PRN

## 2012-09-27 MED ORDER — ALPRAZOLAM 0.5 MG PO TABS
0.5000 mg | ORAL_TABLET | Freq: Three times a day (TID) | ORAL | Status: DC
  Administered 2012-09-27 – 2012-09-29 (×7): 0.5 mg via ORAL
  Filled 2012-09-27 (×7): qty 1

## 2012-09-27 MED ORDER — TETANUS-DIPHTH-ACELL PERTUSSIS 5-2.5-18.5 LF-MCG/0.5 IM SUSP
0.5000 mL | Freq: Once | INTRAMUSCULAR | Status: AC
  Administered 2012-09-28: 0.5 mL via INTRAMUSCULAR
  Filled 2012-09-27: qty 0.5

## 2012-09-27 MED ORDER — WITCH HAZEL-GLYCERIN EX PADS
1.0000 "application " | MEDICATED_PAD | CUTANEOUS | Status: DC | PRN
  Administered 2012-09-27: 1 via TOPICAL

## 2012-09-27 NOTE — Progress Notes (Signed)
UR chart review completed.  

## 2012-09-27 NOTE — Anesthesia Postprocedure Evaluation (Signed)
  Anesthesia Post-op Note  Patient: Amber Fitzpatrick  Procedure(s) Performed: * No procedures listed *  Patient Location: Mother/Baby  Anesthesia Type:Epidural  Level of Consciousness: awake, alert , oriented and patient cooperative  Airway and Oxygen Therapy: Patient Spontanous Breathing  Post-op Pain: mild  Post-op Assessment: Patient's Cardiovascular Status Stable, Respiratory Function Stable, Patent Airway, Adequate PO intake and Pain level controlled  Post-op Vital Signs: Reviewed and stable  Complications: No apparent anesthesia complications

## 2012-09-27 NOTE — Progress Notes (Signed)
Admission nutrition screen triggered. Patients chart reviewed and assessed  for nutritional risk. Pt with Hx of hyperemesis, reports n/v entire pregnancy. No net weight gain. Pre-pregnancy weight per PNR 125 Lbs, current weight 125 Lbs.  Infant IUGR.   Encourage po intake meals plus snacks. Pt reports appetite has returned and she is hungry. She has a [facility] Rx for Ensure  8 oz TID, which I encouraged her to fill and consume between meals.  Elisabeth Cara M.Odis Luster LDN Neonatal Nutrition Support Specialist Pager [phone]
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
## 2012-09-27 NOTE — Progress Notes (Signed)
Patient ID: Amber Fitzpatrick, female   DOB: 08/17/1990, 22 y.o.   MRN: [id number] Vital signs normal Fundus firm Lochia moderate Legs negative No complaints

## 2012-09-28 MED ORDER — LABETALOL HCL 200 MG PO TABS
200.0000 mg | ORAL_TABLET | Freq: Two times a day (BID) | ORAL | Status: DC
  Administered 2012-09-28 – 2012-09-29 (×3): 200 mg via ORAL
  Filled 2012-09-28 (×5): qty 1

## 2012-09-28 NOTE — Progress Notes (Signed)
Patient ID: Amber Fitzpatrick, female   DOB: 1991/05/21, 22 y.o.   MRN: [id number] Postpartum day one Blood pressure 128/86 fluid since then since delivery diastolics have been on 200 01 no complaints Fundus firm legs Legs negative  pih.negative Was started on labetalol 200 mg twice a day today

## 2012-09-29 NOTE — Progress Notes (Signed)
Ppostpartum day 2 Blood pressure 128/78 2 151/91atient ID: Amber Fitzpatrick, female   DOB: 1991-01-25, 22 y.o.   MRpatient is on labetalol 200 mg by mouth twice a day She is asymptomatic Fundus firm Lochia moderate Legs negative to be discharged today on labetalol twice a day to see me in one week for blood pressure checkN: [id number]

## 2012-09-29 NOTE — Discharge Summary (Signed)
Obstetric Discharge Summary Reason for Admission: induction of labor Prenatal Procedures: none Intrapartum Procedures: spontaneous vaginal delivery Postpartum Procedures: none Complications-Operative and Postpartum: none Hemoglobin  Date Value Range Status  09/27/2012 8.7* 12.0 - 15.0 g/dL Final     HCT  Date Value Range Status  09/27/2012 25.7* 36.0 - 46.0 % Final    Physical Exam:  General: alert Lochia: appropriate Uterine Fundus: firm Incision: healing well DVT Evaluation: No evidence of DVT seen on physical exam.  Discharge Diagnoses: Term Pregnancy-delivered  Discharge Information: Date: 09/29/2012 Activity: pelvic rest Diet: routine Medications: Percocet Condition: stable Instructions: refer to practice specific booklet Discharge to: home Follow-up Information   Follow up with Willet Schleifer A, MD. Schedule an appointment as soon as possible for a visit in 1 week.   Contact information:   [address] [place] [address] [phone]       Newborn Data: Live born female  Birth Weight: 5 lb 10 oz (2551 g) APGAR: 8, 9  Home with mother.  Jie Stickels A 09/29/2012, 6:57 AM
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
## 2012-10-05 ENCOUNTER — Telehealth (HOSPITAL_COMMUNITY): Payer: Self-pay | Admitting: *Deleted

## 2012-10-05 NOTE — Telephone Encounter (Signed)
Resolve episode 

## 2013-09-28 ENCOUNTER — Encounter (HOSPITAL_BASED_OUTPATIENT_CLINIC_OR_DEPARTMENT_OTHER): Payer: Self-pay | Admitting: Emergency Medicine

## 2013-09-28 ENCOUNTER — Emergency Department (HOSPITAL_BASED_OUTPATIENT_CLINIC_OR_DEPARTMENT_OTHER)
Admission: EM | Admit: 2013-09-28 | Discharge: 2013-09-28 | Disposition: A | Discharge status: Home / Self Care | Payer: Medicaid Other | Attending: Emergency Medicine | Admitting: Emergency Medicine

## 2013-09-28 DIAGNOSIS — J45909 Unspecified asthma, uncomplicated: Secondary | ICD-10-CM | POA: Insufficient documentation

## 2013-09-28 DIAGNOSIS — A499 Bacterial infection, unspecified: Secondary | ICD-10-CM | POA: Insufficient documentation

## 2013-09-28 DIAGNOSIS — B9689 Other specified bacterial agents as the cause of diseases classified elsewhere: Secondary | ICD-10-CM | POA: Insufficient documentation

## 2013-09-28 DIAGNOSIS — Z3202 Encounter for pregnancy test, result negative: Secondary | ICD-10-CM | POA: Insufficient documentation

## 2013-09-28 DIAGNOSIS — Z8659 Personal history of other mental and behavioral disorders: Secondary | ICD-10-CM | POA: Insufficient documentation

## 2013-09-28 DIAGNOSIS — N390 Urinary tract infection, site not specified: Secondary | ICD-10-CM

## 2013-09-28 DIAGNOSIS — B373 Candidiasis of vulva and vagina: Secondary | ICD-10-CM | POA: Insufficient documentation

## 2013-09-28 DIAGNOSIS — Z87891 Personal history of nicotine dependence: Secondary | ICD-10-CM | POA: Insufficient documentation

## 2013-09-28 DIAGNOSIS — B379 Candidiasis, unspecified: Secondary | ICD-10-CM

## 2013-09-28 DIAGNOSIS — B3731 Acute candidiasis of vulva and vagina: Secondary | ICD-10-CM | POA: Insufficient documentation

## 2013-09-28 DIAGNOSIS — Z862 Personal history of diseases of the blood and blood-forming organs and certain disorders involving the immune mechanism: Secondary | ICD-10-CM | POA: Insufficient documentation

## 2013-09-28 DIAGNOSIS — N760 Acute vaginitis: Secondary | ICD-10-CM | POA: Insufficient documentation

## 2013-09-28 LAB — URINALYSIS, ROUTINE W REFLEX MICROSCOPIC
Bilirubin Urine: NEGATIVE
Glucose, UA: NEGATIVE mg/dL
Hgb urine dipstick: NEGATIVE
Ketones, ur: NEGATIVE mg/dL
Nitrite: NEGATIVE
PROTEIN: NEGATIVE mg/dL
SPECIFIC GRAVITY, URINE: 1.01 (ref 1.005–1.030)
UROBILINOGEN UA: 0.2 mg/dL (ref 0.0–1.0)
pH: 7 (ref 5.0–8.0)

## 2013-09-28 LAB — WET PREP, GENITAL: TRICH WET PREP: NONE SEEN

## 2013-09-28 LAB — CBC WITH DIFFERENTIAL/PLATELET
BASOS ABS: 0 10*3/uL (ref 0.0–0.1)
Basophils Relative: 0 % (ref 0–1)
EOS PCT: 2 % (ref 0–5)
Eosinophils Absolute: 0.2 10*3/uL (ref 0.0–0.7)
HCT: 29.4 % — ABNORMAL LOW (ref 36.0–46.0)
Hemoglobin: 9.5 g/dL — ABNORMAL LOW (ref 12.0–15.0)
LYMPHS PCT: 24 % (ref 12–46)
Lymphs Abs: 2.7 10*3/uL (ref 0.7–4.0)
MCH: 24.4 pg — ABNORMAL LOW (ref 26.0–34.0)
MCHC: 32.3 g/dL (ref 30.0–36.0)
MCV: 75.4 fL — ABNORMAL LOW (ref 78.0–100.0)
Monocytes Absolute: 0.9 10*3/uL (ref 0.1–1.0)
Monocytes Relative: 8 % (ref 3–12)
NEUTROS ABS: 7.4 10*3/uL (ref 1.7–7.7)
NEUTROS PCT: 65 % (ref 43–77)
PLATELETS: 298 10*3/uL (ref 150–400)
RBC: 3.9 MIL/uL (ref 3.87–5.11)
RDW: 18.9 % — AB (ref 11.5–15.5)
WBC: 11.3 10*3/uL — AB (ref 4.0–10.5)

## 2013-09-28 LAB — PREGNANCY, URINE: PREG TEST UR: NEGATIVE

## 2013-09-28 LAB — URINE MICROSCOPIC-ADD ON

## 2013-09-28 MED ORDER — AZITHROMYCIN 250 MG PO TABS
1000.0000 mg | ORAL_TABLET | Freq: Once | ORAL | Status: AC
  Administered 2013-09-28: 1000 mg via ORAL
  Filled 2013-09-28: qty 4

## 2013-09-28 MED ORDER — FLUCONAZOLE 50 MG PO TABS
150.0000 mg | ORAL_TABLET | Freq: Once | ORAL | Status: AC
  Administered 2013-09-28: 150 mg via ORAL
  Filled 2013-09-28 (×2): qty 1

## 2013-09-28 MED ORDER — METRONIDAZOLE 500 MG PO TABS: 500.0000 mg | ORAL_TABLET | Freq: Two times a day (BID) | ORAL | Status: DC

## 2013-09-28 MED ORDER — CEFTRIAXONE SODIUM 250 MG IJ SOLR
250.0000 mg | Freq: Once | INTRAMUSCULAR | Status: AC
  Administered 2013-09-28: 250 mg via INTRAMUSCULAR
  Filled 2013-09-28: qty 250

## 2013-09-28 NOTE — ED Provider Notes (Signed)
CSN: [id number]     Arrival date & time 09/28/13  1102 History   First MD Initiated Contact with Patient 09/28/13 1209     Chief Complaint  Patient presents with  . Vaginal Discharge     (Consider location/radiation/quality/duration/timing/severity/associated sxs/prior Treatment) Patient is a 23 y.o. female presenting with vaginal discharge. The history is provided by the patient.  Vaginal Discharge Quality:  Wallace Cullens Severity:  Moderate Onset quality:  Gradual Timing:  Constant Progression:  Worsening Chronicity:  New Relieved by:  None tried Ineffective treatments:  None tried Associated symptoms: dysuria and vaginal itching   Associated symptoms: no abdominal pain, no fever, no nausea and no vomiting   Risk factors: terminated pregnancy    Necole JENIA KLEPPER is a 23 y.o. G3, P0,1,2,1, who presents to the ED with vaginal discharge. She reports having had a TAB 3 weeks ago @ approximately [redacted] weeks gestation.  She was given Amoxicillin but only took for a couple days. She has dysuria but no frequent urination.she denies abdominal pain.  She has been sexually active since the procedure without protection. She request testing for STI's. She reports heavy vaginal discharge with a bad odor. Last Pap Smear 2014 and was abnormal and was to follow up but never did.   Past Medical History  Diagnosis Date  . Asthma   . Anemia   . Anxiety   . Mental disorder   . Abnormal Pap smear     f/u ok  . Infection   . Trichimoniasis    Past Surgical History  Procedure Laterality Date  . No past surgeries    . Induced abortion     Family History  Problem Relation Age of Onset  . Anesthesia problems Neg Hx   . Other Neg Hx   . Hypertension Mother   . Hyperlipidemia Mother   . Hearing loss Father    History  Substance Use Topics  . Smoking status: Former Games developer  . Smokeless tobacco: Never Used  . Alcohol Use: No     Comment: marijuana bid   OB History   Grav Para Term Preterm Abortions  TAB SAB Ect Mult Living   2 1 1  1 1    1      Review of Systems  Constitutional: Negative for fever and chills.  HENT: Negative.   Eyes: Negative for visual disturbance.  Respiratory: Negative for cough and shortness of breath.   Cardiovascular: Negative for chest pain.  Gastrointestinal: Negative for nausea, vomiting and abdominal pain.  Genitourinary: Positive for dysuria and vaginal discharge. Negative for urgency, frequency and decreased urine volume.  Skin: Negative for rash.  Neurological: Negative for syncope and headaches.  Psychiatric/Behavioral: Negative for confusion. The patient is not nervous/anxious.       Allergies  Sulfa antibiotics  Home Medications  No current outpatient prescriptions on file. There were no vitals taken for this visit. Physical Exam  Nursing note and vitals reviewed. Constitutional: She is oriented to person, place, and time. She appears well-developed and well-nourished. No distress.  HENT:  Head: Normocephalic.  Eyes: Conjunctivae and EOM are normal.  Neck: Neck supple.  Cardiovascular: Normal rate and regular rhythm.   Pulmonary/Chest: Effort normal. She has no wheezes. She has no rales.  Abdominal: Soft. Bowel sounds are normal. There is no tenderness.  Genitourinary:  External genitalia without lesions, thick white discharge vaginal vault. No CMT, no adnexal tenderness, uterus without palpable enlargement.   Musculoskeletal: Normal range of motion.  Neurological: She is  alert and oriented to person, place, and time. No cranial nerve deficit.  Skin: Skin is warm and dry.  Psychiatric: She has a normal mood and affect. Her behavior is normal.   Results for orders placed during the hospital encounter of 09/28/13 (from the past 24 hour(s))  URINALYSIS, ROUTINE W REFLEX MICROSCOPIC     Status: Abnormal   Collection Time    09/28/13 11:24 AM      Result Value Ref Range   Color, Urine YELLOW  YELLOW   APPearance CLOUDY (*) CLEAR    Specific Gravity, Urine 1.010  1.005 - 1.030   pH 7.0  5.0 - 8.0   Glucose, UA NEGATIVE  NEGATIVE mg/dL   Hgb urine dipstick NEGATIVE  NEGATIVE   Bilirubin Urine NEGATIVE  NEGATIVE   Ketones, ur NEGATIVE  NEGATIVE mg/dL   Protein, ur NEGATIVE  NEGATIVE mg/dL   Urobilinogen, UA 0.2  0.0 - 1.0 mg/dL   Nitrite NEGATIVE  NEGATIVE   Leukocytes, UA MODERATE (*) NEGATIVE  PREGNANCY, URINE     Status: None   Collection Time    09/28/13 11:24 AM      Result Value Ref Range   Preg Test, Ur NEGATIVE  NEGATIVE  URINE MICROSCOPIC-ADD ON     Status: Abnormal   Collection Time    09/28/13 11:24 AM      Result Value Ref Range   Squamous Epithelial / LPF MANY (*) RARE   WBC, UA 11-20  <3 WBC/hpf   RBC / HPF 0-2  <3 RBC/hpf   Bacteria, UA MANY (*) RARE  CBC WITH DIFFERENTIAL     Status: Abnormal   Collection Time    09/28/13 12:25 PM      Result Value Ref Range   WBC 11.3 (*) 4.0 - 10.5 K/uL   RBC 3.90  3.87 - 5.11 MIL/uL   Hemoglobin 9.5 (*) 12.0 - 15.0 g/dL   HCT 16.1 (*) 09.6 - 04.5 %   MCV 75.4 (*) 78.0 - 100.0 fL   MCH 24.4 (*) 26.0 - 34.0 pg   MCHC 32.3  30.0 - 36.0 g/dL   RDW 40.9 (*) 81.1 - 91.4 %   Platelets 298  150 - 400 K/uL   Neutrophils Relative % 65  43 - 77 %   Neutro Abs 7.4  1.7 - 7.7 K/uL   Lymphocytes Relative 24  12 - 46 %   Lymphs Abs 2.7  0.7 - 4.0 K/uL   Monocytes Relative 8  3 - 12 %   Monocytes Absolute 0.9  0.1 - 1.0 K/uL   Eosinophils Relative 2  0 - 5 %   Eosinophils Absolute 0.2  0.0 - 0.7 K/uL   Basophils Relative 0  0 - 1 %   Basophils Absolute 0.0  0.0 - 0.1 K/uL  WET PREP, GENITAL     Status: Abnormal   Collection Time    09/28/13 12:39 PM      Result Value Ref Range   Yeast Wet Prep HPF POC FEW (*) NONE SEEN   Trich, Wet Prep NONE SEEN  NONE SEEN   Clue Cells Wet Prep HPF POC FEW (*) NONE SEEN   WBC, Wet Prep HPF POC MANY (*) NONE SEEN    ED Course  Procedures Rocephin, Zithromax, Diflucan MDM  23 y.o. female with vaginal discharge and  dysuria s/p TAB 3 weeks ago and unprotected sex since the procedure. She did not take her antibiotics that she was given. Will treat  to cover GC and Chlamydia, cultures sent. Will treat for yeast and BV. Patient stable for discharge without abdominal pain, fever or other problems. She is to follow up with her GYN or go to the health department for her follow up Pap.     [facility] Orlene OchM Ne[place], [place]NP 09/28/13 1310

## 2013-09-28 NOTE — ED Notes (Signed)
Pt amb to room 1 with quick steady gait in nad. Pt states she had an abortion 3 weeks ago, did not finish up her amoxicillin, now has had grey discharge with foul odor, pt states she has been sexually active and wants checked for std's also. Pt states she is not using birth control, pt teaching regarding importance of using contraception to prevent unwanted pregnancy.

## 2013-09-29 LAB — GC/CHLAMYDIA PROBE AMP
CT Probe RNA: NEGATIVE
GC Probe RNA: NEGATIVE

## 2013-09-29 NOTE — ED Provider Notes (Signed)
Medical screening examination/treatment/procedure(s) were performed by non-physician practitioner and as supervising physician I was immediately available for consultation/collaboration.   EKG Interpretation None        Shanna CiscoMegan E Docherty, MD 09/29/13 2258

## 2014-05-21 ENCOUNTER — Encounter (HOSPITAL_BASED_OUTPATIENT_CLINIC_OR_DEPARTMENT_OTHER): Payer: Self-pay | Admitting: Emergency Medicine

## 2015-01-30 ENCOUNTER — Encounter (HOSPITAL_BASED_OUTPATIENT_CLINIC_OR_DEPARTMENT_OTHER): Payer: Self-pay

## 2015-01-30 ENCOUNTER — Emergency Department (HOSPITAL_BASED_OUTPATIENT_CLINIC_OR_DEPARTMENT_OTHER)
Admission: EM | Admit: 2015-01-30 | Discharge: 2015-01-30 | Disposition: A | Discharge status: Home / Self Care | Payer: Medicaid Other | Attending: Emergency Medicine | Admitting: Emergency Medicine

## 2015-01-30 DIAGNOSIS — J45909 Unspecified asthma, uncomplicated: Secondary | ICD-10-CM | POA: Insufficient documentation

## 2015-01-30 DIAGNOSIS — N760 Acute vaginitis: Secondary | ICD-10-CM | POA: Insufficient documentation

## 2015-01-30 DIAGNOSIS — Z87891 Personal history of nicotine dependence: Secondary | ICD-10-CM | POA: Insufficient documentation

## 2015-01-30 DIAGNOSIS — Z8619 Personal history of other infectious and parasitic diseases: Secondary | ICD-10-CM | POA: Insufficient documentation

## 2015-01-30 DIAGNOSIS — B9689 Other specified bacterial agents as the cause of diseases classified elsewhere: Secondary | ICD-10-CM

## 2015-01-30 DIAGNOSIS — Z862 Personal history of diseases of the blood and blood-forming organs and certain disorders involving the immune mechanism: Secondary | ICD-10-CM | POA: Insufficient documentation

## 2015-01-30 DIAGNOSIS — Z3202 Encounter for pregnancy test, result negative: Secondary | ICD-10-CM | POA: Insufficient documentation

## 2015-01-30 DIAGNOSIS — F410 Panic disorder [episodic paroxysmal anxiety] without agoraphobia: Secondary | ICD-10-CM

## 2015-01-30 DIAGNOSIS — N739 Female pelvic inflammatory disease, unspecified: Secondary | ICD-10-CM | POA: Insufficient documentation

## 2015-01-30 LAB — URINALYSIS, ROUTINE W REFLEX MICROSCOPIC
Bilirubin Urine: NEGATIVE
Glucose, UA: NEGATIVE mg/dL
Ketones, ur: NEGATIVE mg/dL
LEUKOCYTES UA: NEGATIVE
Nitrite: NEGATIVE
PROTEIN: NEGATIVE mg/dL
Specific Gravity, Urine: 1.018 (ref 1.005–1.030)
UROBILINOGEN UA: 0.2 mg/dL (ref 0.0–1.0)
pH: 6.5 (ref 5.0–8.0)

## 2015-01-30 LAB — URINE MICROSCOPIC-ADD ON

## 2015-01-30 LAB — WET PREP, GENITAL
Trich, Wet Prep: NONE SEEN
YEAST WET PREP: NONE SEEN

## 2015-01-30 LAB — PREGNANCY, URINE: PREG TEST UR: NEGATIVE

## 2015-01-30 MED ORDER — DOXYCYCLINE HYCLATE 100 MG PO CAPS: 100.0000 mg | ORAL_CAPSULE | Freq: Two times a day (BID) | ORAL | Status: DC

## 2015-01-30 MED ORDER — CEFTRIAXONE SODIUM 250 MG IJ SOLR
250.0000 mg | Freq: Once | INTRAMUSCULAR | Status: AC
  Administered 2015-01-30: 250 mg via INTRAMUSCULAR
  Filled 2015-01-30: qty 250

## 2015-01-30 MED ORDER — LIDOCAINE HCL (PF) 1 % IJ SOLN
INTRAMUSCULAR | Status: AC
Start: 2015-01-30 — End: 2015-01-30
  Administered 2015-01-30: 2 mL
  Filled 2015-01-30: qty 5

## 2015-01-30 MED ORDER — AZITHROMYCIN 250 MG PO TABS
1000.0000 mg | ORAL_TABLET | Freq: Once | ORAL | Status: AC
  Administered 2015-01-30: 1000 mg via ORAL
  Filled 2015-01-30: qty 4

## 2015-01-30 MED ORDER — METRONIDAZOLE 500 MG PO TABS: 500.0000 mg | ORAL_TABLET | Freq: Two times a day (BID) | ORAL | Status: DC

## 2015-01-30 NOTE — ED Provider Notes (Signed)
CSN: [id number]     Arrival date & time 01/30/15  1138 History   First MD Initiated Contact with Patient 01/30/15 1205     Chief Complaint  Patient presents with  . Anxiety  . Vaginal Bleeding     (Consider location/radiation/quality/duration/timing/severity/associated sxs/prior Treatment) HPI Comments: 24 year old female presenting with anxiety and vaginal bleeding. States prior to arriving to the ED, she started to have a panic attack, and when she gets anxious, she has vaginal spotting. Panic attacks began while she was at work, states someone at work is threatening to "beat me up" which made her feel very anxious and noticed some vaginal spotting. LMP 01/17/2015, subsided, and then she began spotting daily since. She is able to keep the same pattern on throughout the day. Admits to associated lower abdominal cramping that feels like menstrual cramps that are intermittent, coming go at random and rated 7/10. Denies fever, chills, vomiting. Felt slightly nauseated with the panic attack. Had an episode of diarrhea when she used the restroom here in the ED. Sexually active with one partner and does not use protection. Not on any birth control. Denies vaginal discharge, increased urinary frequency, urgency, dysuria. Previously took Xanax for her anxiety which she does not like and no longer takes.  Patient is a 24 y.o. female presenting with anxiety and vaginal bleeding. The history is provided by the patient.  Anxiety Associated symptoms include abdominal pain and nausea (with panic attack).  Vaginal Bleeding Associated symptoms: abdominal pain and nausea (with panic attack)     Past Medical History  Diagnosis Date  . Asthma   . Anemia   . Anxiety   . Mental disorder   . Abnormal Pap smear     f/u ok  . Infection   . Trichimoniasis    Past Surgical History  Procedure Laterality Date  . No past surgeries    . Induced abortion     Family History  Problem Relation Age of Onset  .  Anesthesia problems Neg Hx   . Other Neg Hx   . Hypertension Mother   . Hyperlipidemia Mother   . Hearing loss Father    History  Substance Use Topics  . Smoking status: Former Games developermoker  . Smokeless tobacco: Never Used  . Alcohol Use: No   OB History    Gravida Para Term Preterm AB TAB SAB Ectopic Multiple Living   2 1 1  1 1    1      Review of Systems  Gastrointestinal: Positive for nausea (with panic attack) and abdominal pain.  Genitourinary: Positive for vaginal bleeding.  Psychiatric/Behavioral: The patient is nervous/anxious.   All other systems reviewed and are negative.     Allergies  Sulfa antibiotics  Home Medications   Prior to Admission medications   Medication Sig Start Date End Date Taking? Authorizing Provider  doxycycline (VIBRAMYCIN) 100 MG capsule Take 1 capsule (100 mg total) by mouth 2 (two) times daily. One po bid x 14 days 01/30/15   Kathrynn Speedobyn M Willian Donson, PA-C  metroNIDAZOLE (FLAGYL) 500 MG tablet Take 1 tablet (500 mg total) by mouth 2 (two) times daily. One po bid x 7 days 01/30/15   Nada Boozerobyn M Tahmir Kleckner, PA-C   BP 146/97 mmHg  Pulse 76  Temp(Src) 98.5 F (36.9 C) (Oral)  Resp 24  Ht 5\' 3"  (1.6 m)  Wt 125 lb (56.7 kg)  BMI 22.15 kg/m2  SpO2 100%  LMP 01/17/2015 Physical Exam  Constitutional: She is oriented to person,  place, and time. She appears well-developed and well-nourished. No distress.  HENT:  Head: Normocephalic and atraumatic.  Mouth/Throat: Oropharynx is clear and moist.  Eyes: Conjunctivae and EOM are normal.  Neck: Normal range of motion. Neck supple.  Cardiovascular: Normal rate, regular rhythm and normal heart sounds.   Pulmonary/Chest: Effort normal and breath sounds normal. No respiratory distress.  Abdominal: Soft. Bowel sounds are normal. She exhibits no distension. There is no tenderness.  Genitourinary: Uterus normal. Cervix exhibits motion tenderness and discharge. Right adnexum displays no tenderness. Left adnexum displays no  tenderness. No bleeding in the vagina. Vaginal discharge found.  Musculoskeletal: Normal range of motion. She exhibits no edema.  Neurological: She is alert and oriented to person, place, and time. No sensory deficit.  Skin: Skin is warm and dry.  Psychiatric: Her mood appears anxious. She is hyperactive.  Nursing note and vitals reviewed.   ED Course  Procedures (including critical care time) Labs Review Labs Reviewed  WET PREP, GENITAL - Abnormal; Notable for the following:    Clue Cells Wet Prep HPF POC FEW (*)    WBC, Wet Prep HPF POC FEW (*)    All other components within normal limits  URINALYSIS, ROUTINE W REFLEX MICROSCOPIC (NOT AT [facility]) - Abnormal; Notable for the following:    Hgb urine dipstick SMALL (*)    All other components within normal limits  URINE MICROSCOPIC-ADD ON - Abnormal; Notable for the following:    Bacteria, UA FEW (*)    All other components within normal limits  PREGNANCY, URINE  GC/CHLAMYDIA PROBE AMP ([place]) NOT AT [facility]    Imaging Review No results found.   EKG Interpretation None      MDM   Final diagnoses:  Panic attack  PID (pelvic inflammatory disease)  BV (bacterial vaginosis)   Nontoxic appearing, NAD. AF VSS. Patient appears anxious. She drove herself to the emergency department and states antianxiety medications make her sleepy. Will hold on giving any anxiety medication at this time. Regarding vaginal bleeding, there is no vaginal bleeding on my exam. She does have cervical motion tenderness. No adnexal tenderness. Doubt TOA. Wet prep significant for clue cells and white blood cells. Negative pregnancy. Reports having PID in the past. Rocephin and azithromycin given in the ED, discharge home with 2 weeks of doxycycline and one week of Flagyl. She has an OB/GYN who she will follow-up with and advised PCP f/u regarding anxiety. Stable for discharge. Return precautions given. Patient states understanding of treatment care plan and  is agreeable.  Kathrynn Speed, PA-C 01/30/15 1344  Tilden Fossa, MD 01/30/15 1346

## 2015-01-30 NOTE — ED Notes (Signed)
Pt states "when I get upset or stressed out, I can feel a gush of blood come out, and there will be a spot on my pad". Pt states that as long as she does not feel stressed, she has no vaginal bleeding.

## 2015-01-30 NOTE — Discharge Instructions (Signed)
Take both antibiotics as directed. Take doxycycline twice a day for 2 weeks and Flagyl twice a day for 1 week. No sexual intercourse until 1 week after completion of antibiotics. Follow-up with your OB/GYN.  Bacterial Vaginosis Bacterial vaginosis is a vaginal infection that occurs when the normal balance of bacteria in the vagina is disrupted. It results from an overgrowth of certain bacteria. This is the most common vaginal infection in women of childbearing age. Treatment is important to prevent complications, especially in pregnant women, as it can cause a premature delivery. CAUSES  Bacterial vaginosis is caused by an increase in harmful bacteria that are normally present in smaller amounts in the vagina. Several different kinds of bacteria can cause bacterial vaginosis. However, the reason that the condition develops is not fully understood. RISK FACTORS Certain activities or behaviors can put you at an increased risk of developing bacterial vaginosis, including:  Having a new sex partner or multiple sex partners.  Douching.  Using an intrauterine device (IUD) for contraception. Women do not get bacterial vaginosis from toilet seats, bedding, swimming pools, or contact with objects around them. SIGNS AND SYMPTOMS  Some women with bacterial vaginosis have no signs or symptoms. Common symptoms include:  Grey vaginal discharge.  A fishlike odor with discharge, especially after sexual intercourse.  Itching or burning of the vagina and vulva.  Burning or pain with urination. DIAGNOSIS  Your health care provider will take a medical history and examine the vagina for signs of bacterial vaginosis. A sample of vaginal fluid may be taken. Your health care provider will look at this sample under a microscope to check for bacteria and abnormal cells. A vaginal pH test may also be done.  TREATMENT  Bacterial vaginosis may be treated with antibiotic medicines. These may be given in the form of a  pill or a vaginal cream. A second round of antibiotics may be prescribed if the condition comes back after treatment.  HOME CARE INSTRUCTIONS   Only take over-the-counter or prescription medicines as directed by your health care provider.  If antibiotic medicine was prescribed, take it as directed. Make sure you finish it even if you start to feel better.  Do not have sex until treatment is completed.  Tell all sexual partners that you have a vaginal infection. They should see their health care provider and be treated if they have problems, such as a mild rash or itching.  Practice safe sex by using condoms and only having one sex partner. SEEK MEDICAL CARE IF:   Your symptoms are not improving after 3 days of treatment.  You have increased discharge or pain.  You have a fever. MAKE SURE YOU:   Understand these instructions.  Will watch your condition.  Will get help right away if you are not doing well or get worse. FOR MORE INFORMATION  Centers for Disease Control and Prevention, Division of STD Prevention: Solution[employer]s.co.za American Sexual Health Association (ASHA): www.ashastd.org  Document Released: 07/06/2005 Document Revised: 04/26/2013 Document Reviewed: 02/15/2013 [facility] Patient Information 2015 [place], [place]. This information is not intended to replace advice given to you by your health care provider. Make sure you discuss any questions you have with your health care provider.  Panic Attacks Panic attacks are sudden, short-livedsurges of severe anxiety, fear, or discomfort. They may occur for no reason when you are relaxed, when you are anxious, or when you are sleeping. Panic attacks may occur for a number of reasons:   Healthy people occasionally have panic  attacks in extreme, life-threatening situations, such as war or natural disasters. Normal anxiety is a protective mechanism of the body that helps [place] react to danger (fight or flight response).  Panic attacks  are often seen with anxiety disorders, such as panic disorder, social anxiety disorder, generalized anxiety disorder, and phobias. Anxiety disorders cause excessive or uncontrollable anxiety. They may interfere with your relationships or other life activities.  Panic attacks are sometimes seen with other mental illnesses, such as depression and posttraumatic stress disorder.  Certain medical conditions, prescription medicines, and drugs of abuse can cause panic attacks. SYMPTOMS  Panic attacks start suddenly, peak within 20 minutes, and are accompanied by four or more of the following symptoms:  Pounding heart or fast heart rate (palpitations).  Sweating.  Trembling or shaking.  Shortness of breath or feeling smothered.  Feeling choked.  Chest pain or discomfort.  Nausea or strange feeling in your stomach.  Dizziness, light-headedness, or feeling like you will faint.  Chills or hot flushes.  Numbness or tingling in your lips or hands and feet.  Feeling that things are not real or feeling that you are not yourself.  Fear of losing control or going crazy.  Fear of dying. Some of these symptoms can mimic serious medical conditions. For example, you may think you are having a heart attack. Although panic attacks can be very scary, they are not life threatening. DIAGNOSIS  Panic attacks are diagnosed through an assessment by your health care provider. Your health care provider will ask questions about your symptoms, such as where and when they occurred. Your health care provider will also ask about your medical history and use of alcohol and drugs, including prescription medicines. Your health care provider may order blood tests or other studies to rule out a serious medical condition. Your health care provider may refer you to a mental health professional for further evaluation. TREATMENT   Most healthy people who have one or two panic attacks in an extreme, life-threatening  situation will not require treatment.  The treatment for panic attacks associated with anxiety disorders or other mental illness typically involves counseling with a mental health professional, medicine, or a combination of both. Your health care provider will help determine what treatment is best for you.  Panic attacks due to physical illness usually go away with treatment of the illness. If prescription medicine is causing panic attacks, talk with your health care provider about stopping the medicine, decreasing the dose, or substituting another medicine.  Panic attacks due to alcohol or drug abuse go away with abstinence. Some adults need professional help in order to stop drinking or using drugs. HOME CARE INSTRUCTIONS   Take all medicines as directed by your health care provider.   Schedule and attend follow-up visits as directed by your health care provider. It is important to keep all your appointments. SEEK MEDICAL CARE IF:  You are not able to take your medicines as prescribed.  Your symptoms do not improve or get worse. SEEK IMMEDIATE MEDICAL CARE IF:   You experience panic attack symptoms that are different than your usual symptoms.  You have serious thoughts about hurting yourself or others.  You are taking medicine for panic attacks and have a serious side effect. MAKE SURE YOU:  Understand these instructions.  Will watch your condition.  Will get help right away if you are not doing well or get worse. Document Released: 07/06/2005 Document Revised: 07/11/2013 Document Reviewed: 02/17/2013 [facility] Patient Information 2015 [place], [place].  This information is not intended to replace advice given to you by your health care provider. Make sure you discuss any questions you have with your health care provider.  Pelvic Inflammatory Disease Pelvic inflammatory disease (PID) refers to an infection in some or all of the female organs. The infection can be in the uterus,  ovaries, fallopian tubes, or the surrounding tissues in the pelvis. PID can cause abdominal or pelvic pain that comes on suddenly (acute pelvic pain). PID is a serious infection because it can lead to lasting (chronic) pelvic pain or the inability to have children (infertile).  CAUSES  The infection is often caused by the normal bacteria found in the vaginal tissues. PID may also be caused by an infection that is spread during sexual contact. PID can also occur following:   The birth of a baby.   A miscarriage.   An abortion.   Major pelvic surgery.   The use of an intrauterine device (IUD).   A sexual assault.  RISK FACTORS Certain factors can put a person at higher risk for PID, such as:  Being younger than 25 years.  Being sexually active at [place] age.  Usingnonbarrier contraception.  Havingmultiple sexual partners.  Having sex with someone who has symptoms of a genital infection.  Using oral contraception. Other times, certain behaviors can increase the possibility of getting PID, such as:  Having sex during your period.  Using a vaginal douche.  Having an intrauterine device (IUD) in place. SYMPTOMS   Abdominal or pelvic pain.   Fever.   Chills.   Abnormal vaginal discharge.  Abnormal uterine bleeding.   Unusual pain shortly after finishing your period. DIAGNOSIS  Your caregiver will choose some of the following methods to make a diagnosis, such as:   Performinga physical exam and history. A pelvic exam typically reveals a very tender uterus and surrounding pelvis.   Ordering laboratory tests including a pregnancy test, blood tests, and urine test.  Orderingcultures of the vagina and cervix to check for a sexually transmitted infection (STI).  Performing an ultrasound.   Performing a laparoscopic procedure to look inside the pelvis.  TREATMENT   Antibiotic medicines may be prescribed and taken by mouth.   Sexual partners may  be treated when the infection is caused by a sexually transmitted disease (STD).   Hospitalization may be needed to give antibiotics intravenously.  Surgery may be needed, but this is rare. It may take weeks until you are completely well. If you are diagnosed with PID, you should also be checked for human immunodeficiency virus (HIV). HOME CARE INSTRUCTIONS   If given, take your antibiotics as directed. Finish the medicine even if you start to feel better.   Only take over-the-counter or prescription medicines for pain, discomfort, or fever as directed by your caregiver.   Do not have sexual intercourse until treatment is completed or as directed by your caregiver. If PID is confirmed, your recent sexual partner(s) will need treatment.   Keep your follow-up appointments. SEEK MEDICAL CARE IF:   You have increased or abnormal vaginal discharge.   You need prescription medicine for your pain.   You vomit.   You cannot take your medicines.   Your partner has an STD.  SEEK IMMEDIATE MEDICAL CARE IF:   You have a fever.   You have increased abdominal or pelvic pain.   You have chills.   You have pain when you urinate.   You are not better after 72 hours  following treatment.  MAKE SURE YOU:   Understand these instructions.  Will watch your condition.  Will get help right away if you are not doing well or get worse. Document Released: 07/06/2005 Document Revised: 10/31/2012 Document Reviewed: 07/02/2011 [facility]ExitCare Patient Information 2015 [place]. This information is not intended to replace advice given to you by your health care provider. Make sure you discuss any questions you have with your health care provider.

## 2015-01-30 NOTE — ED Notes (Signed)
C/o vaginal bleeding x 2 weeks-pt states she was going to come to ED for c/o-anxiety attack started while at work-pt is hyperventilating, crying, increase in s/s when attempt to talk in triage

## 2015-01-30 NOTE — ED Notes (Signed)
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

## 2015-01-30 NOTE — ED Notes (Signed)
PA at bedside to discuss results of testing. 

## 2015-01-31 LAB — GC/CHLAMYDIA PROBE AMP (~~LOC~~) NOT AT ARMC
CHLAMYDIA, DNA PROBE: NEGATIVE
NEISSERIA GONORRHEA: NEGATIVE

## 2015-02-08 ENCOUNTER — Telehealth (HOSPITAL_COMMUNITY): Payer: Self-pay

## 2015-11-08 ENCOUNTER — Emergency Department (HOSPITAL_BASED_OUTPATIENT_CLINIC_OR_DEPARTMENT_OTHER)
Admission: EM | Admit: 2015-11-08 | Discharge: 2015-11-08 | Disposition: A | Discharge status: Home / Self Care | Payer: Medicaid Other | Attending: Emergency Medicine | Admitting: Emergency Medicine

## 2015-11-08 ENCOUNTER — Encounter (HOSPITAL_BASED_OUTPATIENT_CLINIC_OR_DEPARTMENT_OTHER): Payer: Self-pay | Admitting: Emergency Medicine

## 2015-11-08 DIAGNOSIS — B9689 Other specified bacterial agents as the cause of diseases classified elsewhere: Secondary | ICD-10-CM

## 2015-11-08 DIAGNOSIS — Z87891 Personal history of nicotine dependence: Secondary | ICD-10-CM | POA: Insufficient documentation

## 2015-11-08 DIAGNOSIS — J45909 Unspecified asthma, uncomplicated: Secondary | ICD-10-CM | POA: Insufficient documentation

## 2015-11-08 DIAGNOSIS — N760 Acute vaginitis: Secondary | ICD-10-CM

## 2015-11-08 LAB — URINALYSIS, ROUTINE W REFLEX MICROSCOPIC
Bilirubin Urine: NEGATIVE
Glucose, UA: 100 mg/dL — AB
Hgb urine dipstick: NEGATIVE
Ketones, ur: NEGATIVE mg/dL
Leukocytes, UA: NEGATIVE
Nitrite: NEGATIVE
Protein, ur: NEGATIVE mg/dL
Specific Gravity, Urine: 1.024 (ref 1.005–1.030)
pH: 6 (ref 5.0–8.0)

## 2015-11-08 LAB — PREGNANCY, URINE: PREG TEST UR: NEGATIVE

## 2015-11-08 LAB — WET PREP, GENITAL
Sperm: NONE SEEN
Trich, Wet Prep: NONE SEEN
Yeast Wet Prep HPF POC: NONE SEEN

## 2015-11-08 MED ORDER — CEFTRIAXONE SODIUM 250 MG IJ SOLR
250.0000 mg | Freq: Once | INTRAMUSCULAR | Status: AC
  Administered 2015-11-08: 250 mg via INTRAMUSCULAR
  Filled 2015-11-08: qty 250

## 2015-11-08 MED ORDER — LIDOCAINE HCL (PF) 1 % IJ SOLN
INTRAMUSCULAR | Status: AC
  Administered 2015-11-08: 5 mL
  Filled 2015-11-08: qty 5

## 2015-11-08 MED ORDER — METRONIDAZOLE 500 MG PO TABS: 500.0000 mg | ORAL_TABLET | Freq: Two times a day (BID) | ORAL | Status: DC

## 2015-11-08 MED ORDER — AZITHROMYCIN 250 MG PO TABS
1000.0000 mg | ORAL_TABLET | Freq: Once | ORAL | Status: AC
  Administered 2015-11-08: 1000 mg via ORAL
  Filled 2015-11-08: qty 4

## 2015-11-08 NOTE — Discharge Instructions (Signed)

## 2015-11-08 NOTE — ED Provider Notes (Signed)
CSN: [id number]     Arrival date & time 11/08/15  1914 History   First MD Initiated Contact with Patient 11/08/15 [id number]     Chief Complaint  Patient presents with  . Abdominal Pain    HPI   25 year old female presents today with complaints of vaginal discharge. Patient reports that in July 2016 she was seen for similar presentation. She notes at that time she was having vaginal bleeding, discharge and abdominal discomfort. She reports that she was discharged home with diagnosis of pelvic inflammatory disease, and given a prescription for doxycycline and metronidazole. She notes that after 2 days of taking the doxycycline she had to discontinue it as it caused her to hurt everywhere, and feel "paralyzed". She notes she continue the metronidazole, and had complete resolution of her symptoms. Patient reports she was asymptomatic until approximately one month ago when she had an increase in vaginal discharge. She describes this as clear, normal for her, with increased amount. She reports that she intermittently has lower pelvic and pain in her bladder, none presently. Patient reports that she is sexually active, has a monogamous partner, does not use birth control. Patient reports her last normal menstrual cycle was on April 28, is expecting her cycle started again next week.  Patient reports the lower abdominal pain as "cramping", and "feels like she is about to have her cycle". She denies any severe pain, vaginal bleeding, fever, chills, nausea, vomiting, or any other concerning signs or symptoms.   Past Medical History  Diagnosis Date  . Asthma   . Anemia   . Anxiety   . Mental disorder   . Abnormal Pap smear     f/u ok  . Infection   . Trichimoniasis    Past Surgical History  Procedure Laterality Date  . No past surgeries    . Induced abortion     Family History  Problem Relation Age of Onset  . Anesthesia problems Neg Hx   . Other Neg Hx   . Hypertension Mother   . Hyperlipidemia  Mother   . Hearing loss Father    Social History  Substance Use Topics  . Smoking status: Former Games developer  . Smokeless tobacco: Never Used  . Alcohol Use: No   OB History    Gravida Para Term Preterm AB TAB SAB Ectopic Multiple Living   Review of Systems  All other systems reviewed and are negative.     Allergies  Sulfa antibiotics  Home Medications   Prior to Admission medications   Medication Sig Start Date End Date Taking? Authorizing Provider  doxycycline (VIBRAMYCIN) 100 MG capsule Take 1 capsule (100 mg total) by mouth 2 (two) times daily. One po bid x 14 days 01/30/15   Kathrynn Speed, PA-C  metroNIDAZOLE (FLAGYL) 500 MG tablet Take 1 tablet (500 mg total) by mouth 2 (two) times daily. 11/08/15   Jorey Dollard, PA-C   BP 143/106 mmHg  Pulse 70  Temp(Src) 98.9 F (37.2 C) (Oral)  Resp 18  Ht  (1.6 m)  Wt 64.864 kg  BMI 25.34 kg/m2  SpO2 100%    Physical Exam  Constitutional: She is oriented to person, place, and time. She appears well-developed and well-nourished.  HENT:  Head: Normocephalic and atraumatic.  Eyes: Conjunctivae are normal. Pupils are equal, round, and reactive to light. Right eye exhibits no discharge. Left eye exhibits no discharge. No scleral icterus.  Neck: Normal range of motion. No JVD present. No tracheal deviation present.  Pulmonary/Chest: Effort normal. No stridor.  Abdominal: Soft. She exhibits no distension and no mass. There is no tenderness. There is no rebound and no guarding.  Genitourinary: Uterus normal. Uterus is not deviated, not enlarged, not fixed and not tender. Cervix exhibits no motion tenderness and no friability. Right adnexum displays no mass, no tenderness and no fullness. Left adnexum displays no mass, no tenderness and no fullness. No erythema, tenderness or bleeding in the vagina. No foreign body around the vagina. No signs of injury around the vagina. Vaginal discharge found.  Neurological: She  is alert and oriented to person, place, and time. Coordination normal.  Skin: Skin is warm and dry. No rash noted. No erythema. No pallor.  Psychiatric: She has a normal mood and affect. Her behavior is normal. Judgment and thought content normal.  Nursing note and vitals reviewed.   ED Course  Procedures (including critical care time) Labs Review Labs Reviewed  WET PREP, GENITAL - Abnormal; Notable for the following:    Clue Cells Wet Prep HPF POC PRESENT (*)    WBC, Wet Prep HPF POC MODERATE (*)    All other components within normal limits  URINALYSIS, ROUTINE W REFLEX MICROSCOPIC ([facility]) - Abnormal; Notable for the following:    Glucose, UA 100 (*)    All other components within normal limits  PREGNANCY, URINE  HIV ANTIBODY (ROUTINE TESTING)  GC/CHLAMYDIA PROBE AMP ([place]) [facility]    Imaging Review No results found. I have personally reviewed and evaluated these images and lab results as part of my medical decision-making.   EKG Interpretation None      MDM   Final diagnoses:  Bacterial vaginosis    Labs: Wet prep, HIV, urinalysis, urine pregnant- clue cells  Imaging:  Consults:  Therapeutics: Ceftriaxone, azithromycin  Discharge Meds: Metronidazole  Assessment/Plan: Patient presents with vaginal discharge. Patient has complaints of intermittent abdominal discomfort, none presently, very low suspicion for significant intra-abdominal pelvic pathology that would necessitate immediate evaluation here in the ED. Patient has very small amount of white discharge consistent with bacterial vaginosis. Patient is requesting prophylactic STD treatment. Strict return precautions given.         Eyvonne MechanicJeffrey Massiel Stipp, PA-C 11/08/15 1157  Linwood DibblesJon Knapp, MD 11/08/15 [phone]

## 2015-11-08 NOTE — ED Notes (Signed)
Low abd pain  And anxiety x 1 month

## 2015-11-09 LAB — HIV ANTIBODY (ROUTINE TESTING W REFLEX): HIV Screen 4th Generation wRfx: NONREACTIVE

## 2015-11-11 LAB — GC/CHLAMYDIA PROBE AMP (~~LOC~~) NOT AT ARMC
Chlamydia: NEGATIVE
Neisseria Gonorrhea: NEGATIVE

## 2015-11-11 MED FILL — metroNIDAZOLE 500 MG TABS: 500 | 7 days supply | Qty: 14 | Fill #0

## 2015-12-03 ENCOUNTER — Emergency Department (HOSPITAL_BASED_OUTPATIENT_CLINIC_OR_DEPARTMENT_OTHER)
Admission: EM | Admit: 2015-12-03 | Discharge: 2015-12-03 | Disposition: A | Discharge status: Home / Self Care | Payer: Self-pay | Attending: Emergency Medicine | Admitting: Emergency Medicine

## 2015-12-03 ENCOUNTER — Encounter (HOSPITAL_BASED_OUTPATIENT_CLINIC_OR_DEPARTMENT_OTHER): Payer: Self-pay | Admitting: *Deleted

## 2015-12-03 ENCOUNTER — Emergency Department (HOSPITAL_BASED_OUTPATIENT_CLINIC_OR_DEPARTMENT_OTHER): Payer: Self-pay

## 2015-12-03 DIAGNOSIS — F17210 Nicotine dependence, cigarettes, uncomplicated: Secondary | ICD-10-CM | POA: Insufficient documentation

## 2015-12-03 DIAGNOSIS — O210 Mild hyperemesis gravidarum: Secondary | ICD-10-CM | POA: Insufficient documentation

## 2015-12-03 DIAGNOSIS — J45909 Unspecified asthma, uncomplicated: Secondary | ICD-10-CM | POA: Insufficient documentation

## 2015-12-03 LAB — URINE MICROSCOPIC-ADD ON

## 2015-12-03 LAB — COMPREHENSIVE METABOLIC PANEL
ALT: 10 U/L — ABNORMAL LOW (ref 14–54)
ANION GAP: 7 (ref 5–15)
AST: 17 U/L (ref 15–41)
Albumin: 4.1 g/dL (ref 3.5–5.0)
Alkaline Phosphatase: 31 U/L — ABNORMAL LOW (ref 38–126)
BUN: 7 mg/dL (ref 6–20)
CHLORIDE: 102 mmol/L (ref 101–111)
CO2: 25 mmol/L (ref 22–32)
CREATININE: 0.68 mg/dL (ref 0.44–1.00)
Calcium: 9.6 mg/dL (ref 8.9–10.3)
GLUCOSE: 62 mg/dL — AB (ref 65–99)
POTASSIUM: 3.1 mmol/L — AB (ref 3.5–5.1)
Sodium: 134 mmol/L — ABNORMAL LOW (ref 135–145)
Total Bilirubin: 0.5 mg/dL (ref 0.3–1.2)
Total Protein: 7.6 g/dL (ref 6.5–8.1)

## 2015-12-03 LAB — CBC WITH DIFFERENTIAL/PLATELET
Basophils Absolute: 0 10*3/uL (ref 0.0–0.1)
Basophils Relative: 0 %
EOS PCT: 1 %
Eosinophils Absolute: 0.1 10*3/uL (ref 0.0–0.7)
HEMATOCRIT: 32.5 % — AB (ref 36.0–46.0)
Hemoglobin: 10.9 g/dL — ABNORMAL LOW (ref 12.0–15.0)
LYMPHS ABS: 2.2 10*3/uL (ref 0.7–4.0)
LYMPHS PCT: 21 %
MCH: 26.8 pg (ref 26.0–34.0)
MCHC: 33.5 g/dL (ref 30.0–36.0)
MCV: 79.9 fL (ref 78.0–100.0)
MONO ABS: 0.7 10*3/uL (ref 0.1–1.0)
MONOS PCT: 7 %
NEUTROS ABS: 7.5 10*3/uL (ref 1.7–7.7)
Neutrophils Relative %: 71 %
PLATELETS: 345 10*3/uL (ref 150–400)
RBC: 4.07 MIL/uL (ref 3.87–5.11)
RDW: 19.1 % — AB (ref 11.5–15.5)
WBC: 10.4 10*3/uL (ref 4.0–10.5)

## 2015-12-03 LAB — URINALYSIS, ROUTINE W REFLEX MICROSCOPIC
BILIRUBIN URINE: NEGATIVE
Glucose, UA: NEGATIVE mg/dL
HGB URINE DIPSTICK: NEGATIVE
KETONES UR: NEGATIVE mg/dL
Nitrite: NEGATIVE
PROTEIN: NEGATIVE mg/dL
Specific Gravity, Urine: 1.021 (ref 1.005–1.030)
pH: 7 (ref 5.0–8.0)

## 2015-12-03 LAB — PREGNANCY, URINE: PREG TEST UR: POSITIVE — AB

## 2015-12-03 LAB — LIPASE, BLOOD: Lipase: 15 U/L (ref 11–51)

## 2015-12-03 LAB — HCG, QUANTITATIVE, PREGNANCY: hCG, Beta Chain, Quant, S: 109675 m[IU]/mL — ABNORMAL HIGH (ref <5)

## 2015-12-03 MED ORDER — ONDANSETRON HCL 4 MG PO TABS
4.0000 mg | ORAL_TABLET | Freq: Four times a day (QID) | ORAL | Status: DC
Start: 2015-12-03 — End: 2015-12-11

## 2015-12-03 MED ORDER — ONDANSETRON HCL 4 MG/2ML IJ SOLN
4.0000 mg | Freq: Once | INTRAMUSCULAR | Status: AC
  Administered 2015-12-03: 4 mg via INTRAVENOUS
  Filled 2015-12-03: qty 2

## 2015-12-03 MED ORDER — KETOROLAC TROMETHAMINE 30 MG/ML IJ SOLN
30.0000 mg | Freq: Once | INTRAMUSCULAR | Status: DC
  Filled 2015-12-03: qty 1

## 2015-12-03 MED ORDER — SODIUM CHLORIDE 0.9 % IV BOLUS (SEPSIS)
1000.0000 mL | Freq: Once | INTRAVENOUS | Status: AC
  Administered 2015-12-03: 1000 mL via INTRAVENOUS

## 2015-12-03 NOTE — Discharge Instructions (Signed)
Zofran as prescribed as needed for nausea.  Please follow-up with your obstetrician in the next week.   Hyperemesis Gravidarum Hyperemesis gravidarum is a severe form of nausea and vomiting that happens during pregnancy. Hyperemesis is worse than morning sickness. It may cause you to have nausea or vomiting all day for many days. It may keep you from eating and drinking enough food and liquids. Hyperemesis usually occurs during the first half (the first 20 weeks) of pregnancy. It often goes away once a woman is in her second half of pregnancy. However, sometimes hyperemesis continues through an entire pregnancy.  CAUSES  The cause of this condition is not completely known but is thought to be related to changes in the body's hormones when pregnant. It could be from the high level of the pregnancy hormone or an increase in estrogen in the body.  SIGNS AND SYMPTOMS   Severe nausea and vomiting.  Nausea that does not go away.  Vomiting that does not allow you to keep any food down.  Weight loss and body fluid loss (dehydration).  Having no desire to eat or not liking food you have previously enjoyed. DIAGNOSIS  Your health care provider will do a physical exam and ask you about your symptoms. He or she may also order blood tests and urine tests to make sure something else is not causing the problem.  TREATMENT  You may only need medicine to control the problem. If medicines do not control the nausea and vomiting, you will be treated in the hospital to prevent dehydration, increased acid in the blood (acidosis), weight loss, and changes in the electrolytes in your body that may harm the unborn baby (fetus). You may need IV fluids.  HOME CARE INSTRUCTIONS   Only take over-the-counter or prescription medicines as directed by your health care provider.  Try eating a couple of dry crackers or toast in the morning before getting out of bed.  Avoid foods and smells that upset your  stomach.  Avoid fatty and spicy foods.  Eat 5-6 small meals a day.  Do not drink when eating meals. Drink between meals.  For snacks, eat high-protein foods, such as cheese.  Eat or suck on things that have ginger in them. Ginger helps nausea.  Avoid food preparation. The smell of food can spoil your appetite.  Avoid iron pills and iron in your multivitamins until after 3-4 months of being pregnant. However, consult with your health care provider before stopping any prescribed iron pills. SEEK MEDICAL CARE IF:   Your abdominal pain increases.  You have a severe headache.  You have vision problems.  You are losing weight. SEEK IMMEDIATE MEDICAL CARE IF:   You are unable to keep fluids down.  You vomit blood.  You have constant nausea and vomiting.  You have excessive weakness.  You have extreme thirst.  You have dizziness or fainting.  You have a fever or persistent symptoms for more than 2-3 days.  You have a fever and your symptoms suddenly get worse. MAKE SURE YOU:   Understand these instructions.  Will watch your condition.  Will get help right away if you are not doing well or get worse.   This information is not intended to replace advice given to you by your health care provider. Make sure you discuss any questions you have with your health care provider.   Document Released: 07/06/2005 Document Revised: 04/26/2013 Document Reviewed: 02/15/2013 Elsevier Interactive Patient Education [employer] Else[employer] Inc[employer]

## 2015-12-03 NOTE — ED Provider Notes (Signed)
CSN: [id number]     Arrival date & time 12/03/15  0941 History   First MD Initiated Contact with Patient 12/03/15 0945     Chief Complaint  Patient presents with  . Emesis     (Consider location/radiation/quality/duration/timing/severity/associated sxs/prior Treatment) HPI Comments: Patient is a 25 year old female with no significant past medical history. She presents for evaluation of vomiting. She reports nausea and vomiting that has occurred approximately 10 times per day for the past week. She denies any significant abdominal discomfort. Her last period was one month ago and was normal. She was recently seen here for the feet and treated with Flagyl which she completed approximately 1 week ago.  She does admit to daily marijuana use.  Patient is a 25 y.o. female presenting with vomiting. The history is provided by the patient.  Emesis Severity:  Moderate Duration:  1 week Timing:  Constant Progression:  Worsening Chronicity:  New Recent urination:  Normal Relieved by:  Nothing Worsened by:  Nothing tried Ineffective treatments:  None tried Associated symptoms: no abdominal pain, no chills, no diarrhea and no fever     Past Medical History  Diagnosis Date  . Asthma   . Anemia   . Anxiety   . Mental disorder   . Abnormal Pap smear     f/u ok  . Infection   . Trichimoniasis    Past Surgical History  Procedure Laterality Date  . No past surgeries    . Induced abortion     Family History  Problem Relation Age of Onset  . Anesthesia problems Neg Hx   . Other Neg Hx   . Hypertension Mother   . Hyperlipidemia Mother   . Hearing loss Father    Social History  Substance Use Topics  . Smoking status: Current Every Day Smoker -- 0.50 packs/day    Types: Cigarettes  . Smokeless tobacco: Never Used  . Alcohol Use: No   OB History    Gravida Para Term Preterm AB TAB SAB Ectopic Multiple Living   2 1 1  1 1    1      Review of Systems  Constitutional: Negative for  chills.  Gastrointestinal: Positive for vomiting. Negative for abdominal pain and diarrhea.  All other systems reviewed and are negative.     Allergies  Sulfa antibiotics  Home Medications   Prior to Admission medications   Not on File   BP 133/96 mmHg  Pulse 65  Temp(Src) 98.6 F (37 C) (Oral)  Resp 18  Ht 5\' 3"  (1.6 m)  Wt 142 lb (64.411 kg)  BMI 25.16 kg/m2  SpO2 100%  LMP 10/19/2015 Physical Exam  Constitutional: She is oriented to person, place, and time. She appears well-developed and well-nourished. No distress.  HENT:  Head: Normocephalic and atraumatic.  Neck: Normal range of motion. Neck supple.  Cardiovascular: Normal rate and regular rhythm.  Exam reveals no gallop and no friction rub.   No murmur heard. Pulmonary/Chest: Effort normal and breath sounds normal. No respiratory distress. She has no wheezes.  Abdominal: Soft. Bowel sounds are normal. She exhibits no distension. There is no tenderness.  Musculoskeletal: Normal range of motion.  Neurological: She is alert and oriented to person, place, and time.  Skin: Skin is warm and dry. She is not diaphoretic.  Nursing note and vitals reviewed.   ED Course  Procedures (including critical care time) Labs Review Labs Reviewed  URINALYSIS, ROUTINE W REFLEX MICROSCOPIC (NOT AT [facility])  PREGNANCY, URINE  Imaging Review No results found. I have personally reviewed and evaluated these images and lab results as part of my medical decision-making.   EKG Interpretation None      MDM   Final diagnoses:  None    Workup reveals a positive urine pregnancy test. Her beta-hCG is 110,000 and ultrasound reveals a 7 week intrauterine pregnancy. She reports hyperemesis with her prior pregnancy and I suspect that is what is occurring now. She will be discharged with Zofran and advised to follow-up with her obstetrician.    Amber Lyons, MD 12/03/15 [phone]

## 2015-12-03 NOTE — ED Notes (Signed)
C/o vomiting x 1 week. Feels like she has a lot of gas. C/o left lower abd. No vaginal discharge. No urinary sx.

## 2015-12-11 ENCOUNTER — Encounter (HOSPITAL_BASED_OUTPATIENT_CLINIC_OR_DEPARTMENT_OTHER): Payer: Self-pay | Admitting: *Deleted

## 2015-12-11 ENCOUNTER — Emergency Department (HOSPITAL_BASED_OUTPATIENT_CLINIC_OR_DEPARTMENT_OTHER)
Admission: EM | Admit: 2015-12-11 | Discharge: 2015-12-11 | Disposition: A | Discharge status: Home / Self Care | Payer: Medicaid Other | Attending: Emergency Medicine | Admitting: Emergency Medicine

## 2015-12-11 DIAGNOSIS — O210 Mild hyperemesis gravidarum: Secondary | ICD-10-CM | POA: Insufficient documentation

## 2015-12-11 DIAGNOSIS — J45909 Unspecified asthma, uncomplicated: Secondary | ICD-10-CM | POA: Insufficient documentation

## 2015-12-11 DIAGNOSIS — Z3A01 Less than 8 weeks gestation of pregnancy: Secondary | ICD-10-CM | POA: Insufficient documentation

## 2015-12-11 DIAGNOSIS — F17210 Nicotine dependence, cigarettes, uncomplicated: Secondary | ICD-10-CM | POA: Insufficient documentation

## 2015-12-11 LAB — BASIC METABOLIC PANEL
Anion gap: 6 (ref 5–15)
BUN: 5 mg/dL — AB (ref 6–20)
CALCIUM: 9.4 mg/dL (ref 8.9–10.3)
CO2: 24 mmol/L (ref 22–32)
CREATININE: 0.61 mg/dL (ref 0.44–1.00)
Chloride: 104 mmol/L (ref 101–111)
GFR calc Af Amer: >60 mL/min (ref >60)
Glucose, Bld: 90 mg/dL (ref 65–99)
POTASSIUM: 3.2 mmol/L — AB (ref 3.5–5.1)
SODIUM: 134 mmol/L — AB (ref 135–145)

## 2015-12-11 LAB — CBC WITH DIFFERENTIAL/PLATELET
BASOS ABS: 0 10*3/uL (ref 0.0–0.1)
BASOS PCT: 0 %
EOS PCT: 1 %
Eosinophils Absolute: 0.1 10*3/uL (ref 0.0–0.7)
HCT: 33.2 % — ABNORMAL LOW (ref 36.0–46.0)
Hemoglobin: 11.2 g/dL — ABNORMAL LOW (ref 12.0–15.0)
Lymphocytes Relative: 22 %
Lymphs Abs: 2.1 10*3/uL (ref 0.7–4.0)
MCH: 26.9 pg (ref 26.0–34.0)
MCHC: 33.7 g/dL (ref 30.0–36.0)
MCV: 79.6 fL (ref 78.0–100.0)
Monocytes Absolute: 0.6 10*3/uL (ref 0.1–1.0)
Monocytes Relative: 7 %
Neutro Abs: 6.7 10*3/uL (ref 1.7–7.7)
Neutrophils Relative %: 70 %
PLATELETS: 337 10*3/uL (ref 150–400)
RBC: 4.17 MIL/uL (ref 3.87–5.11)
RDW: 18.4 % — ABNORMAL HIGH (ref 11.5–15.5)
WBC: 9.5 10*3/uL (ref 4.0–10.5)

## 2015-12-11 MED ORDER — ONDANSETRON HCL 4 MG PO TABS: 4.0000 mg | ORAL_TABLET | Freq: Four times a day (QID) | ORAL | Status: DC

## 2015-12-11 MED ORDER — SODIUM CHLORIDE 0.9 % IV BOLUS (SEPSIS): 500.0000 mL | Freq: Once | INTRAVENOUS | Status: DC

## 2015-12-11 MED ORDER — SODIUM CHLORIDE 0.9 % IV BOLUS (SEPSIS)
1000.0000 mL | Freq: Once | INTRAVENOUS | Status: AC
  Administered 2015-12-11: 1000 mL via INTRAVENOUS

## 2015-12-11 MED ORDER — ONDANSETRON HCL 4 MG/2ML IJ SOLN
4.0000 mg | Freq: Once | INTRAMUSCULAR | Status: AC
  Administered 2015-12-11: 4 mg via INTRAVENOUS
  Filled 2015-12-11: qty 2

## 2015-12-11 MED FILL — ONDANSETRON HCL 4 MG TABLET: 4 | 3 days supply | Qty: 12 | Fill #0

## 2015-12-11 NOTE — Discharge Instructions (Signed)
Zofran as prescribed as needed for nausea.  Return to the ER if symptoms significantly worsen or change. Be sure to follow-up with your OB/GYN in the next few days.   Hyperemesis Gravidarum Hyperemesis gravidarum is a severe form of nausea and vomiting that happens during pregnancy. Hyperemesis is worse than morning sickness. It may cause you to have nausea or vomiting all day for many days. It may keep you from eating and drinking enough food and liquids. Hyperemesis usually occurs during the first half (the first 20 weeks) of pregnancy. It often goes away once a woman is in her second half of pregnancy. However, sometimes hyperemesis continues through an entire pregnancy.  CAUSES  The cause of this condition is not completely known but is thought to be related to changes in the body's hormones when pregnant. It could be from the high level of the pregnancy hormone or an increase in estrogen in the body.  SIGNS AND SYMPTOMS   Severe nausea and vomiting.  Nausea that does not go away.  Vomiting that does not allow you to keep any food down.  Weight loss and body fluid loss (dehydration).  Having no desire to eat or not liking food you have previously enjoyed. DIAGNOSIS  Your health care provider will do a physical exam and ask you about your symptoms. He or she may also order blood tests and urine tests to make sure something else is not causing the problem.  TREATMENT  You may only need medicine to control the problem. If medicines do not control the nausea and vomiting, you will be treated in the hospital to prevent dehydration, increased acid in the blood (acidosis), weight loss, and changes in the electrolytes in your body that may harm the unborn baby (fetus). You may need IV fluids.  HOME CARE INSTRUCTIONS   Only take over-the-counter or prescription medicines as directed by your health care provider.  Try eating a couple of dry crackers or toast in the morning before getting out of  bed.  Avoid foods and smells that upset your stomach.  Avoid fatty and spicy foods.  Eat 5-6 small meals a day.  Do not drink when eating meals. Drink between meals.  For snacks, eat high-protein foods, such as cheese.  Eat or suck on things that have ginger in them. Ginger helps nausea.  Avoid food preparation. The smell of food can spoil your appetite.  Avoid iron pills and iron in your multivitamins until after 3-4 months of being pregnant. However, consult with your health care provider before stopping any prescribed iron pills. SEEK MEDICAL CARE IF:   Your abdominal pain increases.  You have a severe headache.  You have vision problems.  You are losing weight. SEEK IMMEDIATE MEDICAL CARE IF:   You are unable to keep fluids down.  You vomit blood.  You have constant nausea and vomiting.  You have excessive weakness.  You have extreme thirst.  You have dizziness or fainting.  You have a fever or persistent symptoms for more than 2-3 days.  You have a fever and your symptoms suddenly get worse. MAKE SURE YOU:   Understand these instructions.  Will watch your condition.  Will get help right away if you are not doing well or get worse.   This information is not intended to replace advice given to you by your health care provider. Make sure you discuss any questions you have with your health care provider.   Document Released: 07/06/2005 Document Revised: 04/26/2013  Document Reviewed: 02/15/2013 Elsevier Interactive Patient Education [employer].

## 2015-12-11 NOTE — ED Notes (Signed)
Pt reports emesis since just before she found out she was pregnant. Pt is planning to have an abortion next week, but asks for nausea medication to last until next week. Dr. Judd Liendelo at bedside.

## 2015-12-11 NOTE — ED Provider Notes (Signed)
CSN: [id number]     Arrival date & time 12/11/15  4098 History   First MD Initiated Contact with Patient 12/11/15 [phone]     Chief Complaint  Patient presents with  . Hyperemesis Gravidarum     (Consider location/radiation/quality/duration/timing/severity/associated sxs/prior Treatment) HPI Comments: Patient is a 25 year old female with no significant past medical history. She presents for evaluation of severe nausea and vomiting has been ongoing for the past week. She was found to be pregnant when she was seen here by myself last week. She had a history of hyperemesis with her first pregnancy. Today she denies any abdominal pain, diarrhea, bloody stools. She tells me since her last visit here, she has had a consultation to have an abortion performed.  Patient is a 25 y.o. female presenting with vomiting. The history is provided by the patient.  Emesis Severity:  Severe Duration:  1 week Timing:  Constant Progression:  Worsening Chronicity:  New Recent urination:  Decreased Relieved by:  Nothing Worsened by:  Nothing tried Ineffective treatments:  None tried Associated symptoms: no abdominal pain, no diarrhea and no fever     Past Medical History  Diagnosis Date  . Asthma   . Anemia   . Anxiety   . Mental disorder   . Abnormal Pap smear     f/u ok  . Infection   . Trichimoniasis    Past Surgical History  Procedure Laterality Date  . No past surgeries    . Induced abortion     Family History  Problem Relation Age of Onset  . Anesthesia problems Neg Hx   . Other Neg Hx   . Hypertension Mother   . Hyperlipidemia Mother   . Hearing loss Father    Social History  Substance Use Topics  . Smoking status: Current Every Day Smoker -- 0.50 packs/day    Types: Cigarettes  . Smokeless tobacco: Never Used  . Alcohol Use: No   OB History    Gravida Para Term Preterm AB TAB SAB Ectopic Multiple Living   Review of Systems  Gastrointestinal: Positive  for vomiting. Negative for abdominal pain and diarrhea.  All other systems reviewed and are negative.     Allergies  Sulfa antibiotics  Home Medications   Prior to Admission medications   Medication Sig Start Date End Date Taking? Authorizing Provider  ondansetron (ZOFRAN) 4 MG tablet Take 1 tablet (4 mg total) by mouth every 6 (six) hours. 12/03/15   Geoffery Lyons, MD   BP 143/95 mmHg  Pulse 70  Temp(Src) 98.5 F (36.9 C) (Oral)  Resp 22  Ht 5' 3.5" (1.613 m)  Wt 145 lb (65.772 kg)  BMI 25.28 kg/m2  SpO2 100%  LMP 11/15/2015 Physical Exam  Constitutional: She is oriented to person, place, and time. She appears well-developed and well-nourished. No distress.  HENT:  Head: Normocephalic and atraumatic.  Neck: Normal range of motion. Neck supple.  Cardiovascular: Normal rate and regular rhythm.  Exam reveals no gallop and no friction rub.   No murmur heard. Pulmonary/Chest: Effort normal and breath sounds normal. No respiratory distress. She has no wheezes.  Abdominal: Soft. Bowel sounds are normal. She exhibits no distension. There is no tenderness.  Musculoskeletal: Normal range of motion.  Neurological: She is alert and oriented to person, place, and time.  Skin: Skin is warm and dry. She is not diaphoretic.  Nursing note and vitals reviewed.  ED Course  Procedures (including critical care time) Labs Review Labs Reviewed - No data to display  Imaging Review No results found. I have personally reviewed and evaluated these images and lab results as part of my medical decision-making.    MDM   Final diagnoses:  None    Patient presents with vomiting and nausea. She is currently pregnant and the first trimester. She has a history of hyperemesis gravidarum with her first pregnancy. This appears to be the cause of her vomiting. Her abdomen is benign and laboratory studies are reassuring. She has no white count or fever. Her electrolytes reveal no evidence for  significant dehydration. She was hydrated here in the ER and given Zofran. She is now feeling better and I believe appropriate for discharge. She will be given another prescription for Zofran.    Geoffery Lyonsouglas Tajana Crotteau, MD 12/11/15 1014

## 2016-04-07 LAB — LAB REPORT - SCANNED: Pap: ABNORMAL — AB

## 2017-04-03 ENCOUNTER — Encounter (HOSPITAL_COMMUNITY): Payer: Self-pay | Admitting: Emergency Medicine

## 2017-04-03 ENCOUNTER — Emergency Department (HOSPITAL_COMMUNITY)
Admission: EM | Admit: 2017-04-03 | Discharge: 2017-04-03 | Disposition: A | Discharge status: Home / Self Care | Payer: Self-pay | Attending: Emergency Medicine | Admitting: Emergency Medicine

## 2017-04-03 ENCOUNTER — Emergency Department (HOSPITAL_COMMUNITY): Payer: Self-pay

## 2017-04-03 DIAGNOSIS — F17210 Nicotine dependence, cigarettes, uncomplicated: Secondary | ICD-10-CM | POA: Insufficient documentation

## 2017-04-03 DIAGNOSIS — N23 Unspecified renal colic: Secondary | ICD-10-CM | POA: Insufficient documentation

## 2017-04-03 DIAGNOSIS — N200 Calculus of kidney: Secondary | ICD-10-CM | POA: Insufficient documentation

## 2017-04-03 LAB — COMPREHENSIVE METABOLIC PANEL
ALBUMIN: 5 g/dL (ref 3.5–5.0)
ALK PHOS: 46 U/L (ref 38–126)
ALT: 17 U/L (ref 14–54)
AST: 25 U/L (ref 15–41)
Anion gap: 9 (ref 5–15)
BUN: 11 mg/dL (ref 6–20)
CALCIUM: 9.7 mg/dL (ref 8.9–10.3)
CO2: 23 mmol/L (ref 22–32)
CREATININE: 0.87 mg/dL (ref 0.44–1.00)
Chloride: 104 mmol/L (ref 101–111)
GFR calc Af Amer: >60 mL/min (ref >60)
GFR calc non Af Amer: >60 mL/min (ref >60)
GLUCOSE: 92 mg/dL (ref 65–99)
Potassium: 3.6 mmol/L (ref 3.5–5.1)
SODIUM: 136 mmol/L (ref 135–145)
Total Bilirubin: 0.6 mg/dL (ref 0.3–1.2)
Total Protein: 8.3 g/dL — ABNORMAL HIGH (ref 6.5–8.1)

## 2017-04-03 LAB — URINALYSIS, ROUTINE W REFLEX MICROSCOPIC
BACTERIA UA: NONE SEEN
Bilirubin Urine: NEGATIVE
Glucose, UA: NEGATIVE mg/dL
Ketones, ur: NEGATIVE mg/dL
Leukocytes, UA: NEGATIVE
Nitrite: NEGATIVE
PROTEIN: NEGATIVE mg/dL
Specific Gravity, Urine: 1.013 (ref 1.005–1.030)
pH: 6 (ref 5.0–8.0)

## 2017-04-03 LAB — CBC WITH DIFFERENTIAL/PLATELET
BASOS PCT: 0 %
Basophils Absolute: 0 10*3/uL (ref 0.0–0.1)
EOS PCT: 1 %
Eosinophils Absolute: 0.1 10*3/uL (ref 0.0–0.7)
HEMATOCRIT: 34.9 % — AB (ref 36.0–46.0)
Hemoglobin: 11.4 g/dL — ABNORMAL LOW (ref 12.0–15.0)
LYMPHS PCT: 28 %
Lymphs Abs: 2.5 10*3/uL (ref 0.7–4.0)
MCH: 25.5 pg — ABNORMAL LOW (ref 26.0–34.0)
MCHC: 32.7 g/dL (ref 30.0–36.0)
MCV: 78.1 fL (ref 78.0–100.0)
MONO ABS: 0.6 10*3/uL (ref 0.1–1.0)
MONOS PCT: 7 %
NEUTROS ABS: 5.7 10*3/uL (ref 1.7–7.7)
Neutrophils Relative %: 64 %
PLATELETS: 344 10*3/uL (ref 150–400)
RBC: 4.47 MIL/uL (ref 3.87–5.11)
RDW: 17.2 % — AB (ref 11.5–15.5)
WBC: 9 10*3/uL (ref 4.0–10.5)

## 2017-04-03 LAB — PREGNANCY, URINE: PREG TEST UR: NEGATIVE

## 2017-04-03 MED ORDER — MORPHINE SULFATE (PF) 4 MG/ML IV SOLN
4.0000 mg | Freq: Once | INTRAVENOUS | Status: AC
  Administered 2017-04-03: 4 mg via INTRAVENOUS
  Filled 2017-04-03: qty 1

## 2017-04-03 MED ORDER — KETOROLAC TROMETHAMINE 30 MG/ML IJ SOLN
30.0000 mg | Freq: Once | INTRAMUSCULAR | Status: AC
  Administered 2017-04-03: 30 mg via INTRAVENOUS
  Filled 2017-04-03: qty 1

## 2017-04-03 MED ORDER — SODIUM CHLORIDE 0.9 % IV BOLUS (SEPSIS)
1000.0000 mL | Freq: Once | INTRAVENOUS | Status: AC
  Administered 2017-04-03: 1000 mL via INTRAVENOUS

## 2017-04-03 MED ORDER — ONDANSETRON HCL 4 MG/2ML IJ SOLN
4.0000 mg | Freq: Once | INTRAMUSCULAR | Status: AC
  Administered 2017-04-03: 4 mg via INTRAVENOUS
  Filled 2017-04-03: qty 2

## 2017-04-03 MED ORDER — HYDROCODONE-ACETAMINOPHEN 5-325 MG PO TABS: 1.0000 | ORAL_TABLET | ORAL | 0 refills | Status: DC | PRN

## 2017-04-03 MED ORDER — PROMETHAZINE HCL 25 MG PO TABS
25.0000 mg | ORAL_TABLET | Freq: Four times a day (QID) | ORAL | 0 refills | Status: DC | PRN
Start: 2017-04-03 — End: 2018-07-06

## 2017-04-03 MED ORDER — KETOROLAC TROMETHAMINE 10 MG PO TABS: 10.0000 mg | ORAL_TABLET | Freq: Four times a day (QID) | ORAL | 0 refills | Status: DC | PRN

## 2017-04-03 NOTE — ED Triage Notes (Signed)
Patient presents POV requesting help from her car. C/o severe left sided flank pain and abdominal pain. Pt states she had an episode of emesis in her mouth yesterday. Pt is restless and crying during triage.

## 2017-04-03 NOTE — ED Provider Notes (Signed)
[facility]-EMERGENCY DEPT Provider Note   CSN: [id number] Arrival date & time: 04/03/17  1144     History   Chief Complaint Chief Complaint  Patient presents with  . Flank Pain  . Abdominal Pain    HPI Amber Fitzpatrick is a 26 y.o. female.  Pt presents to the ED today with severe left sided flank pain.  Pt also has n/v.  Pt is unable to give a good history as she is in severe pain.  She has never had anything like this in the past.      Past Medical History:  Diagnosis Date  . Abnormal Pap smear    f/u ok  . Anemia   . Anxiety   . Asthma   . Infection   . Mental disorder   . Trichimoniasis     Patient Active Problem List   Diagnosis Date Noted  . Cervical shortening 08/05/2012  . Constipation in pregnancy 07/06/2012  . Hemorrhoids in pregnancy 07/06/2012  . Round ligament pain 07/06/2012  . Hyperemesis gravidarum with electrolyte imbalance 04/01/2012    Past Surgical History:  Procedure Laterality Date  . INDUCED ABORTION    . NO PAST SURGERIES      OB History    Gravida Para Term Preterm AB Living   SAB TAB Ectopic Multiple Live Births     1     1       Home Medications    Prior to Admission medications   Medication Sig Start Date End Date Taking? Authorizing Provider  HYDROcodone-acetaminophen (NORCO/VICODIN) 5-325 MG tablet Take 1 tablet by mouth every 4 (four) hours as needed. 04/03/17   Jacalyn Lefevre, MD  ketorolac (TORADOL) 10 MG tablet Take 1 tablet (10 mg total) by mouth every 6 (six) hours as needed. 04/03/17   Jacalyn Lefevre, MD  promethazine (PHENERGAN) 25 MG tablet Take 1 tablet (25 mg total) by mouth every 6 (six) hours as needed for nausea or vomiting. 04/03/17   Jacalyn Lefevre, MD    Family History Family History  Problem Relation Age of Onset  . Hypertension Mother   . Hyperlipidemia Mother   . Hearing loss Father   . Anesthesia problems Neg Hx   . Other Neg Hx     Social History Social History  Substance Use  Topics  . Smoking status: Current Every Day Smoker    Packs/day: 0.50    Types: Cigarettes  . Smokeless tobacco: Never Used  . Alcohol use No     Allergies   Sulfa antibiotics   Review of Systems Review of Systems  Gastrointestinal: Positive for abdominal pain, nausea and vomiting.  All other systems reviewed and are negative.    Physical Exam Updated Vital Signs BP (!) 147/100   Pulse (!) 48   Temp 97.6 F (36.4 C) (Oral)   Resp 16   Ht 5' 8.5" (1.74 m)   Wt 59 kg (130 lb)   LMP 03/08/2017   SpO2 (!) 82%   BMI 19.48 kg/m   Physical Exam  Constitutional: She is oriented to person, place, and time. She appears well-developed and well-nourished. She appears distressed.  Pt looks extremely uncomfortable.  She is writhing on the bed.  HENT:  Head: Normocephalic and atraumatic.  Right Ear: External ear normal.  Left Ear: External ear normal.  Nose: Nose normal.  Mouth/Throat: Oropharynx is clear and moist.  Eyes: Pupils are equal, round, and reactive to light. Conjunctivae  and EOM are normal.  Neck: Normal range of motion. Neck supple.  Cardiovascular: Normal rate, regular rhythm, normal heart sounds and intact distal pulses.   Pulmonary/Chest: Effort normal and breath sounds normal.  Abdominal: Soft. Bowel sounds are normal.  Musculoskeletal: Normal range of motion.  Neurological: She is alert and oriented to person, place, and time.  Skin: Skin is warm.  Nursing note and vitals reviewed.    ED Treatments / Results  Labs (all labs ordered are listed, but only abnormal results are displayed) Labs Reviewed  COMPREHENSIVE METABOLIC PANEL - Abnormal; Notable for the following:       Result Value   Total Protein 8.3 (*)    All other components within normal limits  CBC WITH DIFFERENTIAL/PLATELET - Abnormal; Notable for the following:    Hemoglobin 11.4 (*)    HCT 34.9 (*)    MCH 25.5 (*)    RDW 17.2 (*)    All other components within normal limits    URINALYSIS, ROUTINE W REFLEX MICROSCOPIC - Abnormal; Notable for the following:    Color, Urine STRAW (*)    Hgb urine dipstick LARGE (*)    Squamous Epithelial / LPF 6-30 (*)    All other components within normal limits  PREGNANCY, URINE    EKG  EKG Interpretation None       Radiology Ct Renal Stone Study  Result Date: 04/03/2017 CLINICAL DATA:  Severe left-sided flank and abdominal pain. EXAM: CT ABDOMEN AND PELVIS WITHOUT CONTRAST TECHNIQUE: Multidetector CT imaging of the abdomen and pelvis was performed following the standard protocol without IV contrast. COMPARISON:  Previous ultrasound studies. FINDINGS: Lower chest: Normal Hepatobiliary: Normal Pancreas: Normal Spleen: Normal Adrenals/Urinary Tract: Adrenal glands are normal. Right kidney contains dozens of small nonobstructing stones, 2-3 mm in size. Left kidney contains dozens of small stones, 2-3 mm in size. There is swelling of the left kidney in there is hydroureteronephrosis with the ureter dilated all the way to the UVJ where there is a 3 mm stone about pass into the bladder. Stomach/Bowel: Normal. Vascular/Lymphatic: Normal Reproductive: Retroverted uterus.  Otherwise normal. Other: No free fluid or air. Musculoskeletal: Normal IMPRESSION: Dozens of small renal calculi on each side, 2-3 mm in size. On the left, the kidney is swollen and there is mild to moderate hydroureteronephrosis due to a 3 mm stone at the left UVJ, about pass into the bladder or just passed into the bladder. Electronically Signed   By: Paulina Fusi M.D.   On: 04/03/2017 13:45    Procedures Procedures (including critical care time)  Medications Ordered in ED Medications  sodium chloride 0.9 % bolus 1,000 mL (0 mLs Intravenous Stopped 04/03/17 1352)  ondansetron (ZOFRAN) injection 4 mg (4 mg Intravenous Given 04/03/17 1201)  morphine 4 MG/ML injection 4 mg (4 mg Intravenous Given 04/03/17 1201)  ketorolac (TORADOL) 30 MG/ML injection 30 mg (30 mg  Intravenous Given 04/03/17 1251)     Initial Impression / Assessment and Plan / ED Course  I have reviewed the triage vital signs and the nursing notes.  Pertinent labs & imaging results that were available during my care of the patient were reviewed by me and considered in my medical decision making (see chart for details).    Pt is feeling much better.  She knows to return if worse.  F/u with urology.  Final Clinical Impressions(s) / ED Diagnoses   Final diagnoses:  Kidney stone  Renal colic on left side    New Prescriptions  Discharge Medication List as of 04/03/2017  2:00 PM    START taking these medications   Details  HYDROcodone-acetaminophen (NORCO/VICODIN) 5-325 MG tablet Take 1 tablet by mouth every 4 (four) hours as needed., Starting Sat 04/03/2017, Print    ketorolac (TORADOL) 10 MG tablet Take 1 tablet (10 mg total) by mouth every 6 (six) hours as needed., Starting Sat 04/03/2017, Print    promethazine (PHENERGAN) 25 MG tablet Take 1 tablet (25 mg total) by mouth every 6 (six) hours as needed for nausea or vomiting., Starting Sat 04/03/2017, Print         Jacalyn Lefevre, MD 04/03/17 1521

## 2017-09-06 ENCOUNTER — Encounter (HOSPITAL_COMMUNITY): Payer: Self-pay | Admitting: Emergency Medicine

## 2017-09-06 ENCOUNTER — Emergency Department (HOSPITAL_COMMUNITY): Payer: Medicaid Other

## 2017-09-06 ENCOUNTER — Emergency Department (HOSPITAL_COMMUNITY)
Admission: EM | Admit: 2017-09-06 | Discharge: 2017-09-06 | Disposition: A | Discharge status: Home / Self Care | Payer: Medicaid Other | Attending: Emergency Medicine | Admitting: Emergency Medicine

## 2017-09-06 DIAGNOSIS — E876 Hypokalemia: Secondary | ICD-10-CM | POA: Diagnosis not present

## 2017-09-06 DIAGNOSIS — R059 Cough, unspecified: Secondary | ICD-10-CM

## 2017-09-06 DIAGNOSIS — J9801 Acute bronchospasm: Secondary | ICD-10-CM

## 2017-09-06 DIAGNOSIS — B349 Viral infection, unspecified: Secondary | ICD-10-CM | POA: Insufficient documentation

## 2017-09-06 DIAGNOSIS — R112 Nausea with vomiting, unspecified: Secondary | ICD-10-CM

## 2017-09-06 DIAGNOSIS — R05 Cough: Secondary | ICD-10-CM

## 2017-09-06 DIAGNOSIS — F17210 Nicotine dependence, cigarettes, uncomplicated: Secondary | ICD-10-CM | POA: Diagnosis not present

## 2017-09-06 LAB — COMPREHENSIVE METABOLIC PANEL
ALBUMIN: 4.2 g/dL (ref 3.5–5.0)
ALT: 21 U/L (ref 14–54)
AST: 26 U/L (ref 15–41)
Alkaline Phosphatase: 50 U/L (ref 38–126)
Anion gap: 12 (ref 5–15)
BUN: 6 mg/dL (ref 6–20)
CHLORIDE: 103 mmol/L (ref 101–111)
CO2: 23 mmol/L (ref 22–32)
Calcium: 9 mg/dL (ref 8.9–10.3)
Creatinine, Ser: 0.71 mg/dL (ref 0.44–1.00)
GFR calc Af Amer: >60 mL/min (ref >60)
GLUCOSE: 94 mg/dL (ref 65–99)
POTASSIUM: 2.9 mmol/L — AB (ref 3.5–5.1)
SODIUM: 138 mmol/L (ref 135–145)
Total Bilirubin: 0.9 mg/dL (ref 0.3–1.2)
Total Protein: 8 g/dL (ref 6.5–8.1)

## 2017-09-06 LAB — CBC WITH DIFFERENTIAL/PLATELET
BASOS ABS: 0 10*3/uL (ref 0.0–0.1)
BASOS PCT: 0 %
EOS ABS: 0.1 10*3/uL (ref 0.0–0.7)
EOS PCT: 1 %
HCT: 34.4 % — ABNORMAL LOW (ref 36.0–46.0)
Hemoglobin: 10.7 g/dL — ABNORMAL LOW (ref 12.0–15.0)
Lymphocytes Relative: 12 %
Lymphs Abs: 1.1 10*3/uL (ref 0.7–4.0)
MCH: 23.7 pg — ABNORMAL LOW (ref 26.0–34.0)
MCHC: 31.1 g/dL (ref 30.0–36.0)
MCV: 76.1 fL — ABNORMAL LOW (ref 78.0–100.0)
MONO ABS: 0.4 10*3/uL (ref 0.1–1.0)
Monocytes Relative: 4 %
Neutro Abs: 7.5 10*3/uL (ref 1.7–7.7)
Neutrophils Relative %: 83 %
PLATELETS: 394 10*3/uL (ref 150–400)
RBC: 4.52 MIL/uL (ref 3.87–5.11)
RDW: 18.3 % — AB (ref 11.5–15.5)
WBC: 9 10*3/uL (ref 4.0–10.5)

## 2017-09-06 LAB — URINALYSIS, ROUTINE W REFLEX MICROSCOPIC
Bilirubin Urine: NEGATIVE
GLUCOSE, UA: NEGATIVE mg/dL
Hgb urine dipstick: NEGATIVE
Ketones, ur: 5 mg/dL — AB
LEUKOCYTES UA: NEGATIVE
Nitrite: NEGATIVE
PROTEIN: NEGATIVE mg/dL
SPECIFIC GRAVITY, URINE: 1.024 (ref 1.005–1.030)
pH: 6 (ref 5.0–8.0)

## 2017-09-06 LAB — I-STAT BETA HCG BLOOD, ED (MC, WL, AP ONLY): I-stat hCG, quantitative: <5 m[IU]/mL (ref <5)

## 2017-09-06 LAB — LIPASE, BLOOD: Lipase: 23 U/L (ref 11–51)

## 2017-09-06 MED ORDER — ONDANSETRON HCL 4 MG/2ML IJ SOLN
4.0000 mg | Freq: Once | INTRAMUSCULAR | Status: AC
  Administered 2017-09-06: 4 mg via INTRAVENOUS
  Filled 2017-09-06: qty 2

## 2017-09-06 MED ORDER — ONDANSETRON 4 MG PO TBDP: ORAL_TABLET | ORAL | 0 refills | Status: DC

## 2017-09-06 MED ORDER — SODIUM CHLORIDE 0.9 % IV BOLUS (SEPSIS)
1000.0000 mL | Freq: Once | INTRAVENOUS | Status: AC
  Administered 2017-09-06: 1000 mL via INTRAVENOUS

## 2017-09-06 MED ORDER — KETOROLAC TROMETHAMINE 30 MG/ML IJ SOLN
30.0000 mg | Freq: Once | INTRAMUSCULAR | Status: AC
  Administered 2017-09-06: 30 mg via INTRAVENOUS
  Filled 2017-09-06: qty 1

## 2017-09-06 MED ORDER — POTASSIUM CHLORIDE CRYS ER 20 MEQ PO TBCR: 20.0000 meq | EXTENDED_RELEASE_TABLET | Freq: Every day | ORAL | 0 refills | Status: DC

## 2017-09-06 MED ORDER — POTASSIUM CHLORIDE 10 MEQ/100ML IV SOLN
10.0000 meq | Freq: Once | INTRAVENOUS | Status: AC
  Administered 2017-09-06: 10 meq via INTRAVENOUS
  Filled 2017-09-06: qty 100

## 2017-09-06 MED ORDER — ALBUTEROL SULFATE HFA 108 (90 BASE) MCG/ACT IN AERS: 1.0000 | INHALATION_SPRAY | Freq: Four times a day (QID) | RESPIRATORY_TRACT | 0 refills | Status: AC | PRN

## 2017-09-06 MED ORDER — POTASSIUM CHLORIDE CRYS ER 20 MEQ PO TBCR
40.0000 meq | EXTENDED_RELEASE_TABLET | Freq: Once | ORAL | Status: AC
  Administered 2017-09-06: 40 meq via ORAL
  Filled 2017-09-06: qty 2

## 2017-09-06 MED ORDER — ALBUTEROL SULFATE (2.5 MG/3ML) 0.083% IN NEBU
2.5000 mg | INHALATION_SOLUTION | Freq: Once | RESPIRATORY_TRACT | Status: AC
  Administered 2017-09-06: 2.5 mg via RESPIRATORY_TRACT
  Filled 2017-09-06: qty 3

## 2017-09-06 MED ORDER — ACETAMINOPHEN 325 MG PO TABS
650.0000 mg | ORAL_TABLET | Freq: Once | ORAL | Status: AC | PRN
  Administered 2017-09-06: 650 mg via ORAL
  Filled 2017-09-06: qty 2

## 2017-09-06 MED ORDER — ALBUTEROL SULFATE HFA 108 (90 BASE) MCG/ACT IN AERS
2.0000 | INHALATION_SPRAY | Freq: Once | RESPIRATORY_TRACT | Status: AC
  Administered 2017-09-06: 2 via RESPIRATORY_TRACT
  Filled 2017-09-06: qty 6.7

## 2017-09-06 NOTE — Discharge Instructions (Signed)
Your evaluation today has been reassuring, nausea and vomiting may be due to a viral syndrome, could also be due to postnasal drip.  You may use Zofran as needed.  Your potassium was low, this is common with vomiting, as well as albuterol use, please take potassium tablets for the next 5 days to ensure this level remains appropriate. Please follow up with your PCP and have this level rechecked.  Will also need to follow-up for continued management of your asthma, this is commonly exacerbated by viral infections, but your lungs sound clear today.  If you have persistent nausea and vomiting and or unable to keep down fluids, fevers, chest pain, difficulty breathing or focal abdominal pain please return to the ED for reevaluation.

## 2017-09-06 NOTE — ED Triage Notes (Signed)
Pt states cough with green mucous with body aches for 4 days. Hx of asthma, states this has caused an asthma exacerbation. 99.9 at triage. Hr 77. Denies nausea denies abdominal pain.

## 2017-09-06 NOTE — ED Provider Notes (Signed)
[facility] EMERGENCY DEPARTMENT Provider Note   CSN: [id number] Arrival date & time: 09/06/17  1191     History   Chief Complaint Chief Complaint  Patient presents with  . Asthma  . URI  . Influenza    HPI  Amber Fitzpatrick is a 27 y.o. Female with a history of asthma, anemia and anxiety, who presents to the ED cough, body aches and nausea vomiting.  Patient reports symptoms initially started 2 weeks ago with what she thought was a cold with some cough and rhinorrhea, the symptoms seem to improve but then last Monday she developed fevers, body aches and worsening productive cough with green mucus.  Patient was starting to feel better over the weekend and yesterday felt like she had finally turned the corner, but then last night she started vomiting and has been unable to keep down food or fluids.  Patient denies obvious abdominal pain, no diarrhea, hematochezia or melena.  Patient denies urinary symptoms, pelvic pain or vaginal discharge.  Patient has not tried anything to treat her symptoms prior to arrival.  Patient does report history of asthma, she has had some intermittent wheezing and chest tightness, has not had an albuterol inhaler at home to use, denies chest pain or shortness of breath.      Past Medical History:  Diagnosis Date  . Abnormal Pap smear    f/u ok  . Anemia   . Anxiety   . Asthma   . Infection   . Mental disorder   . Trichimoniasis     Patient Active Problem List   Diagnosis Date Noted  . Cervical shortening 08/05/2012  . Constipation in pregnancy 07/06/2012  . Hemorrhoids in pregnancy 07/06/2012  . Round ligament pain 07/06/2012  . Hyperemesis gravidarum with electrolyte imbalance 04/01/2012    Past Surgical History:  Procedure Laterality Date  . INDUCED ABORTION    . NO PAST SURGERIES      OB History    Gravida Para Term Preterm AB Living   2 1 1   1 1    SAB TAB Ectopic Multiple Live Births     1     1        Home Medications    Prior to Admission medications   Medication Sig Start Date End Date Taking? Authorizing Provider  HYDROcodone-acetaminophen (NORCO/VICODIN) 5-325 MG tablet Take 1 tablet by mouth every 4 (four) hours as needed. 04/03/17   Jacalyn Lefevre, MD  ketorolac (TORADOL) 10 MG tablet Take 1 tablet (10 mg total) by mouth every 6 (six) hours as needed. 04/03/17   Jacalyn Lefevre, MD  promethazine (PHENERGAN) 25 MG tablet Take 1 tablet (25 mg total) by mouth every 6 (six) hours as needed for nausea or vomiting. 04/03/17   Jacalyn Lefevre, MD    Family History Family History  Problem Relation Age of Onset  . Hypertension Mother   . Hyperlipidemia Mother   . Hearing loss Father   . Anesthesia problems Neg Hx   . Other Neg Hx     Social History Social History   Tobacco Use  . Smoking status: Current Every Day Smoker    Packs/day: 0.50    Types: Cigarettes  . Smokeless tobacco: Never Used  Substance Use Topics  . Alcohol use: No  . Drug use: Yes    Types: Marijuana    Comment: occ     Allergies   Sulfa antibiotics   Review of Systems Review of Systems  Constitutional:  Positive for chills and fatigue. Negative for fever.  HENT: Positive for congestion, rhinorrhea and sore throat. Negative for ear discharge, ear pain and trouble swallowing.   Eyes: Negative for discharge, redness and itching.  Respiratory: Positive for cough and chest tightness. Negative for shortness of breath and wheezing.   Cardiovascular: Negative for chest pain.  Gastrointestinal: Positive for nausea and vomiting. Negative for abdominal pain, blood in stool and diarrhea.  Genitourinary: Negative for dysuria, frequency, pelvic pain, vaginal bleeding and vaginal discharge.  Musculoskeletal: Negative for arthralgias and myalgias.  Skin: Negative for pallor and rash.  Neurological: Negative for dizziness, light-headedness and headaches.     Physical Exam Updated Vital Signs BP (!)  141/99   Pulse 86   Temp 99.9 F (37.7 C) (Oral)   Resp (!) 21   LMP 08/06/2017   SpO2 100%   Physical Exam  Constitutional: She appears well-developed and well-nourished. No distress.  HENT:  Head: Normocephalic and atraumatic.  TMs clear with good landmarks, moderate nasal mucosa edema with clear rhinorrhea, posterior oropharynx clear and moist, with some erythema, no edema or exudates, uvula midline  Eyes: Right eye exhibits no discharge. Left eye exhibits no discharge.  Neck: Neck supple.  Cardiovascular: Normal rate, regular rhythm and normal heart sounds.  Pulmonary/Chest: Effort normal and breath sounds normal. No stridor. No respiratory distress. She has no wheezes. She has no rales.  Abdominal: Soft. Bowel sounds are normal.  Minimal epigastric tenderness, all other quadrants nontender to palpation, no guarding, no rebound, no peritoneal signs  Musculoskeletal: She exhibits no edema or deformity.  Lymphadenopathy:    She has no cervical adenopathy.  Neurological: She is alert. Coordination normal.  Skin: Skin is warm and dry. She is not diaphoretic.  Psychiatric: She has a normal mood and affect. Her behavior is normal.  Nursing note and vitals reviewed.    ED Treatments / Results  Labs (all labs ordered are listed, but only abnormal results are displayed) Labs Reviewed  CBC WITH DIFFERENTIAL/PLATELET - Abnormal; Notable for the following components:      Result Value   Hemoglobin 10.7 (*)    HCT 34.4 (*)    MCV 76.1 (*)    MCH 23.7 (*)    RDW 18.3 (*)    All other components within normal limits  COMPREHENSIVE METABOLIC PANEL - Abnormal; Notable for the following components:   Potassium 2.9 (*)    All other components within normal limits  URINALYSIS, ROUTINE W REFLEX MICROSCOPIC - Abnormal; Notable for the following components:   APPearance HAZY (*)    Ketones, ur 5 (*)    All other components within normal limits  LIPASE, BLOOD  I-STAT BETA HCG BLOOD, ED  ([facility], [facility], [facility] ONLY)    EKG  EKG Interpretation  Date/Time:  Monday September 06 2017 13:34:14 EST Ventricular Rate:  70 PR Interval:  162 QRS Duration: 84 QT Interval:  412 QTC Calculation: 444 R Axis:   40 Text Interpretation:  Normal sinus rhythm Septal infarct , age undetermined T wave abnormality, consider inferior ischemia T wave abnormality, consider anterolateral ischemia Abnormal ECG Confirmed by Alona BeneLong, Joshua [phone]([id number]) on 09/06/2017 3:25:55 PM       Radiology Dg Chest 2 View  Result Date: 09/06/2017 CLINICAL DATA:  Productive cough and body aches. EXAM: CHEST  2 VIEW COMPARISON:  Chest x-ray dated November 16, 2010. FINDINGS: The heart size and mediastinal contours are within normal limits. Normal pulmonary vascularity. No focal consolidation, pleural effusion, or pneumothorax.  No acute osseous abnormality. IMPRESSION: Normal chest x-ray. Electronically Signed   By: Obie Dredge M.D.   On: 09/06/2017 09:56    Procedures Procedures (including critical care time)  Medications Ordered in ED Medications  albuterol (PROVENTIL HFA;VENTOLIN HFA) 108 (90 Base) MCG/ACT inhaler 2 puff (not administered)  acetaminophen (TYLENOL) tablet 650 mg (650 mg Oral Given 09/06/17 0930)  albuterol (PROVENTIL) (2.5 MG/3ML) 0.083% nebulizer solution 2.5 mg (2.5 mg Nebulization Given 09/06/17 0930)  ondansetron (ZOFRAN) injection 4 mg (4 mg Intravenous Given 09/06/17 1144)  sodium chloride 0.9 % bolus 1,000 mL (1,000 mLs Intravenous New Bag/Given 09/06/17 1144)  potassium chloride 10 mEq in 100 mL IVPB (0 mEq Intravenous Stopped 09/06/17 1410)  potassium chloride SA (K-DUR,KLOR-CON) CR tablet 40 mEq (40 mEq Oral Given 09/06/17 1333)  ketorolac (TORADOL) 30 MG/ML injection 30 mg (30 mg Intravenous Given 09/06/17 1333)     Initial Impression / Assessment and Plan / ED Course  I have reviewed the triage vital signs and the nursing notes.  Pertinent labs & imaging results that were available during my care  of the patient were reviewed by me and considered in my medical decision making (see chart for details).  Patient presents to the ED for evaluation of nausea and vomiting since last night, no associated abdominal pain, diarrhea, fevers, urinary symptoms.  Patient also reports for the past 2 weeks she has been dealing with cough and upper respiratory symptoms that have been coming and going, she feels this has exacerbated her asthma and she is out of her inhaler and requesting a refill.  On exam vitals are normal and patient is overall well-appearing, lungs are completely clear to auscultation with good air movement and no wheezing.  Abdominal exam is completely benign.  We will get basic labs, chest x-ray and give IV fluids and Zofran.  Chest x-ray shows no active cardiopulmonary disease, no evidence of pneumonia.  Lab work is overall reassuring, no leukocytosis, hemoglobin is 10.7, patient has a history of chronic anemia and this is appears to be close to her baseline.  Patient is hypokalemic to 2.9, will replace with p.o. potassium and one run of IV potassium and get EKG, no other electrolyte derangements, normal kidney and liver function, normal lipase.  UA without evidence of infection and pregnancy negative.  EKG without concerning changes.  IV potassium had to be discontinued due to pain patient tolerated p.o. potassium without difficulty, tolerating p.o. fluids as well without any additional nausea, abdominal exam remains benign and patient reports she is feeling better overall.  At this time patient is stable for discharge home feel that this was likely due to a viral etiology, patient has had some postnasal drip which may also be contributing to her nausea.  Patient provided with inhaler here in the ED as well as a prescription for an additional albuterol inhaler, given that lungs were clear to auscultation no steroids given.  Provided with prescription for Zofran, as well as a few additional days of  potassium.  Strict return precautions discussed with the patient.  She is to follow-up with her primary care provider to have her potassium rechecked.  Work note provided.  At this time patient is stable for discharge home, expresses understanding and is in agreement with plan.  Final Clinical Impressions(s) / ED Diagnoses   Final diagnoses:  Non-intractable vomiting with nausea, unspecified vomiting type  Viral syndrome  Cough  Bronchospasm  Hypokalemia    ED Discharge Orders  Ordered    ondansetron (ZOFRAN ODT) 4 MG disintegrating tablet     09/06/17 1605    albuterol (PROVENTIL HFA;VENTOLIN HFA) 108 (90 Base) MCG/ACT inhaler  Every 6 hours PRN     09/06/17 1605    potassium chloride SA (K-DUR,KLOR-CON) 20 MEQ tablet  Daily     09/06/17 1609       Dartha Lodge, [place] 09/06/17 1727    Maia Plan, MD 09/06/17 [phone]

## 2017-11-26 ENCOUNTER — Encounter (HOSPITAL_COMMUNITY): Payer: Self-pay | Admitting: *Deleted

## 2017-11-26 ENCOUNTER — Inpatient Hospital Stay (HOSPITAL_COMMUNITY)
Admission: AD | Admit: 2017-11-26 | Discharge: 2017-11-26 | Discharge status: Against Medical Advice | Payer: Medicaid Other | Source: Ambulatory Visit | Attending: Obstetrics & Gynecology | Admitting: Obstetrics & Gynecology

## 2017-11-26 DIAGNOSIS — Z5321 Procedure and treatment not carried out due to patient leaving prior to being seen by health care provider: Secondary | ICD-10-CM | POA: Insufficient documentation

## 2017-11-26 LAB — POCT PREGNANCY, URINE: Preg Test, Ur: NEGATIVE

## 2017-11-26 LAB — URINALYSIS, ROUTINE W REFLEX MICROSCOPIC
Bilirubin Urine: NEGATIVE
GLUCOSE, UA: NEGATIVE mg/dL
Hgb urine dipstick: NEGATIVE
KETONES UR: NEGATIVE mg/dL
LEUKOCYTES UA: NEGATIVE
Nitrite: NEGATIVE
PH: 5 (ref 5.0–8.0)
Protein, ur: NEGATIVE mg/dL
SPECIFIC GRAVITY, URINE: 1.019 (ref 1.005–1.030)

## 2017-11-26 NOTE — MAU Note (Signed)
Pt reports she has a lot of gas every morning for the last week, dizziness. LMP 10/23/2017

## 2017-11-26 NOTE — Progress Notes (Signed)
Negative preg test results shared with pt.  Pt states she is having what she thinks is "gas pain & if I'm not pregnant, that's all I wanted to know."  I clarified with patient if she was trying to leave without being seen by a provider & she did confirm that....she only wanted to check if she was pregnant.  Informed pt that, I was not advising that she leave, however, she states she understands & needs to get back to work.  Crisoforo Oxford, CNM notified. Pt left unit.

## 2018-07-06 ENCOUNTER — Encounter (HOSPITAL_COMMUNITY): Payer: Self-pay | Admitting: *Deleted

## 2018-07-06 ENCOUNTER — Emergency Department (HOSPITAL_COMMUNITY)
Admission: EM | Admit: 2018-07-06 | Discharge: 2018-07-06 | Disposition: A | Discharge status: Home / Self Care | Payer: Medicaid Other | Attending: Emergency Medicine | Admitting: Emergency Medicine

## 2018-07-06 ENCOUNTER — Other Ambulatory Visit: Payer: Self-pay

## 2018-07-06 DIAGNOSIS — J45909 Unspecified asthma, uncomplicated: Secondary | ICD-10-CM | POA: Insufficient documentation

## 2018-07-06 DIAGNOSIS — J101 Influenza due to other identified influenza virus with other respiratory manifestations: Secondary | ICD-10-CM | POA: Insufficient documentation

## 2018-07-06 DIAGNOSIS — J019 Acute sinusitis, unspecified: Secondary | ICD-10-CM

## 2018-07-06 DIAGNOSIS — F17210 Nicotine dependence, cigarettes, uncomplicated: Secondary | ICD-10-CM | POA: Insufficient documentation

## 2018-07-06 DIAGNOSIS — Z79899 Other long term (current) drug therapy: Secondary | ICD-10-CM | POA: Insufficient documentation

## 2018-07-06 LAB — INFLUENZA PANEL BY PCR (TYPE A & B)
INFLBPCR: POSITIVE — AB
Influenza A By PCR: NEGATIVE

## 2018-07-06 MED ORDER — AMOXICILLIN 500 MG PO CAPS: 1000.0000 mg | ORAL_CAPSULE | Freq: Two times a day (BID) | ORAL | 0 refills | Status: DC

## 2018-07-06 MED ORDER — AMOXICILLIN 500 MG PO CAPS
1000.0000 mg | ORAL_CAPSULE | Freq: Once | ORAL | Status: AC
  Administered 2018-07-06: 1000 mg via ORAL
  Filled 2018-07-06: qty 2

## 2018-07-06 MED ORDER — DEXAMETHASONE 4 MG PO TABS
10.0000 mg | ORAL_TABLET | Freq: Once | ORAL | Status: AC
  Administered 2018-07-06: 10 mg via ORAL
  Filled 2018-07-06: qty 2

## 2018-07-06 NOTE — ED Triage Notes (Signed)
Pt reports for seven days she has felt weak, nasal congestion, dizziness, and denies fevers. Has used OTC meds.

## 2018-07-06 NOTE — ED Provider Notes (Addendum)
[facility]-EMERGENCY DEPT Provider Note   CSN: [id number] Arrival date & time: 07/06/18  0404     History   Chief Complaint Chief Complaint  Patient presents with  . Nasal Congestion    HPI Amber Fitzpatrick Shellenbarger is a 27 y.o. female.  The history is provided by the patient.  She has history of asthma and comes in complaining of respiratory infection.  She got sick about a week ago and initially had a cough and subjective fever.  That actually started improving and she now is complaining of severe nasal congestion and pressure in her head.  She is not running fever but is complaining of a sore throat.  There has been no vomiting or diarrhea.  She has had sick contacts.  She does not think she had the influenza immunization this year.  She normally smokes Black and mild cigars, but has not smoked in the last several days because her throat hurts too much.  Past Medical History:  Diagnosis Date  . Abnormal Pap smear    f/u ok  . Anemia   . Anxiety   . Asthma   . Infection   . Mental disorder   . Trichimoniasis     Patient Active Problem List   Diagnosis Date Noted  . Cervical shortening 08/05/2012  . Constipation in pregnancy 07/06/2012  . Hemorrhoids in pregnancy 07/06/2012  . Round ligament pain 07/06/2012  . Hyperemesis gravidarum with electrolyte imbalance 04/01/2012    Past Surgical History:  Procedure Laterality Date  . INDUCED ABORTION    . NO PAST SURGERIES       OB History    Gravida  2   Para  1   Term  1   Preterm      AB  1   Living  1     SAB      TAB  1   Ectopic      Multiple      Live Births  1            Home Medications    Prior to Admission medications   Medication Sig Start Date End Date Taking? Authorizing Provider  albuterol (PROVENTIL HFA;VENTOLIN HFA) 108 (90 Base) MCG/ACT inhaler Inhale 1-2 puffs into the lungs every 6 (six) hours as needed for wheezing or shortness of breath. 09/06/17  Yes Dartha LodgeFord,  Kelsey N, PA-C  dextromethorphan-guaiFENesin (MUCINEX DM) 30-600 MG 12hr tablet Take 1 tablet by mouth 2 (two) times daily as needed for cough.   Yes [provider]  HYDROcodone-acetaminophen (NORCO/VICODIN) 5-325 MG tablet Take 1 tablet by mouth every 4 (four) hours as needed. Patient not taking: Reported on 07/06/2018 04/03/17   Jacalyn LefevreHaviland, Julie, MD  ketorolac (TORADOL) 10 MG tablet Take 1 tablet (10 mg total) by mouth every 6 (six) hours as needed. Patient not taking: Reported on 07/06/2018 04/03/17   Jacalyn LefevreHaviland, Julie, MD  ondansetron [facility](ZOFRAN ODT) 4 MG disintegrating tablet 4mg  ODT q4 hours prn nausea/vomit Patient not taking: Reported on 07/06/2018 09/06/17   Dartha LodgeFord, Kelsey N, PA-C  potassium chloride SA (K-DUR,KLOR-CON) 20 MEQ tablet Take 1 tablet (20 mEq total) by mouth daily. Patient not taking: Reported on 07/06/2018 09/06/17   Dartha LodgeFord, Kelsey N, PA-C  promethazine (PHENERGAN) 25 MG tablet Take 1 tablet (25 mg total) by mouth every 6 (six) hours as needed for nausea or vomiting. Patient not taking: Reported on 07/06/2018 04/03/17   Jacalyn LefevreHaviland, Julie, MD    Family History Family History  Problem Relation Age of Onset  . Hypertension Mother   . Hyperlipidemia Mother   . Hearing loss Father   . Anesthesia problems Neg Hx   . Other Neg Hx     Social History Social History   Tobacco Use  . Smoking status: Current Every Day Smoker    Packs/day: 0.50    Types: Cigarettes  . Smokeless tobacco: Never Used  Substance Use Topics  . Alcohol use: No  . Drug use: Yes    Types: Marijuana    Comment: occ     Allergies   Sulfa antibiotics   Review of Systems Review of Systems  All other systems reviewed and are negative.    Physical Exam Updated Vital Signs BP (!) 145/107 (BP Location: Right Arm)   Pulse 80   Temp 98.3 F (36.8 C)   Resp 16   SpO2 100%   Physical Exam Vitals signs and nursing note reviewed.    27 year old female, resting comfortably and in no acute  distress. Vital signs are significant for elevated blood pressure. Oxygen saturation is 100%, which is normal. Head is normocephalic and atraumatic. PERRLA, EOMI. Oropharynx is clear.  There is moderate tenderness to palpation over maxillary sinuses and mild tenderness palpation over frontal sinuses.  Turbinates are swollen, more so on the left than the right. Neck is nontender and supple without adenopathy or JVD. Back is nontender and there is no CVA tenderness. Lungs are clear without rales, wheezes, or rhonchi. Chest is nontender. Heart has regular rate and rhythm without murmur. Abdomen is soft, flat, nontender without masses or hepatosplenomegaly and peristalsis is normoactive. Extremities have no cyanosis or edema, full range of motion is present. Skin is warm and dry without rash. Neurologic: Mental status is normal, cranial nerves are intact, there are no motor or sensory deficits.  ED Treatments / Results  Labs (all labs ordered are listed, but only abnormal results are displayed) Labs Reviewed  INFLUENZA PANEL BY PCR (TYPE A & B) - Abnormal; Notable for the following components:      Result Value   Influenza B By PCR POSITIVE (*)    All other components within normal limits   Procedures Procedures  Medications Ordered in ED Medications  amoxicillin (AMOXIL) capsule 1,000 mg (has no administration in time range)  dexamethasone (DECADRON) tablet 10 mg (has no administration in time range)     Initial Impression / Assessment and Plan / ED Course  I have reviewed the triage vital signs and the nursing notes.  Pertinent labs results that were available during my care of the patient were reviewed by me and considered in my medical decision making (see chart for details).  Influenza-like illness with secondary sinusitis.  Flu swab is positive for influenza B.  However, she is well outside the window to consider antiviral treatment.  She does appear to have a significant  sinusitis and is given a prescription for amoxicillin.  Old records are reviewed, and she has no relevant past visits.  Final Clinical Impressions(s) / ED Diagnoses   Final diagnoses:  Acute non-recurrent sinusitis, unspecified location  Influenza B    ED Discharge Orders         Ordered    amoxicillin (AMOXIL) 500 MG capsule  2 times daily     07/06/18 0620           Dione Booze, MD 07/06/18 Ulis Rias    Dione Booze, MD 07/06/18 [phone]

## 2018-07-06 NOTE — Discharge Instructions (Signed)
Drink plenty of fluids.    Take acetaminophen, ibuprofen, or naproxen for fever or aching.    Do not smoke.

## 2020-07-03 ENCOUNTER — Emergency Department (HOSPITAL_BASED_OUTPATIENT_CLINIC_OR_DEPARTMENT_OTHER): Payer: Self-pay

## 2020-07-03 ENCOUNTER — Encounter (HOSPITAL_BASED_OUTPATIENT_CLINIC_OR_DEPARTMENT_OTHER): Payer: Self-pay

## 2020-07-03 ENCOUNTER — Emergency Department (HOSPITAL_BASED_OUTPATIENT_CLINIC_OR_DEPARTMENT_OTHER)
Admission: EM | Admit: 2020-07-03 | Discharge: 2020-07-03 | Disposition: A | Discharge status: Home / Self Care | Payer: Self-pay | Attending: Emergency Medicine | Admitting: Emergency Medicine

## 2020-07-03 ENCOUNTER — Other Ambulatory Visit: Payer: Self-pay

## 2020-07-03 DIAGNOSIS — J45909 Unspecified asthma, uncomplicated: Secondary | ICD-10-CM | POA: Insufficient documentation

## 2020-07-03 DIAGNOSIS — R067 Sneezing: Secondary | ICD-10-CM | POA: Insufficient documentation

## 2020-07-03 DIAGNOSIS — R109 Unspecified abdominal pain: Secondary | ICD-10-CM | POA: Insufficient documentation

## 2020-07-03 DIAGNOSIS — M62838 Other muscle spasm: Secondary | ICD-10-CM | POA: Insufficient documentation

## 2020-07-03 DIAGNOSIS — R0602 Shortness of breath: Secondary | ICD-10-CM | POA: Insufficient documentation

## 2020-07-03 DIAGNOSIS — F17210 Nicotine dependence, cigarettes, uncomplicated: Secondary | ICD-10-CM | POA: Insufficient documentation

## 2020-07-03 LAB — URINALYSIS, ROUTINE W REFLEX MICROSCOPIC
Bilirubin Urine: NEGATIVE
Glucose, UA: NEGATIVE mg/dL
Hgb urine dipstick: NEGATIVE
Ketones, ur: NEGATIVE mg/dL
Leukocytes,Ua: NEGATIVE
Nitrite: NEGATIVE
Protein, ur: NEGATIVE mg/dL
Specific Gravity, Urine: 1.02 (ref 1.005–1.030)
pH: 6.5 (ref 5.0–8.0)

## 2020-07-03 LAB — PREGNANCY, URINE: Preg Test, Ur: NEGATIVE

## 2020-07-03 MED ORDER — IBUPROFEN 400 MG PO TABS: 400.0000 mg | ORAL_TABLET | Freq: Three times a day (TID) | ORAL | 0 refills | Status: AC | PRN

## 2020-07-03 MED ORDER — HYDROCODONE-ACETAMINOPHEN 5-325 MG PO TABS
2.0000 | ORAL_TABLET | Freq: Once | ORAL | Status: AC
  Administered 2020-07-03: 2 via ORAL
  Filled 2020-07-03: qty 2

## 2020-07-03 MED ORDER — HYDROCODONE-ACETAMINOPHEN 5-325 MG PO TABS: 1.0000 | ORAL_TABLET | Freq: Four times a day (QID) | ORAL | 0 refills | Status: AC | PRN

## 2020-07-03 MED ORDER — KETOROLAC TROMETHAMINE 30 MG/ML IJ SOLN
60.0000 mg | Freq: Once | INTRAMUSCULAR | Status: AC
  Administered 2020-07-03: 60 mg via INTRAMUSCULAR
  Filled 2020-07-03: qty 2

## 2020-07-03 NOTE — ED Provider Notes (Signed)
[facility] EMERGENCY DEPARTMENT Provider Note   CSN: [id number] Arrival date & time: 07/03/20  0240     History Chief Complaint  Patient presents with  . Shortness of Breath  . Flank Pain    Amber Fitzpatrick is a 29 y.o. female.  The history is provided by the patient.  Flank Pain This is a new problem. The current episode started more than 1 week ago. The problem occurs daily. The problem has been gradually worsening. Pertinent negatives include no abdominal pain. Exacerbated by: Movement/palpation/breathing and sneezing. Relieved by: Certain positions. Treatments tried: Muscle relaxant. The treatment provided no relief.   Patient reports over the past several weeks she has been having increasing pain in her right flank.  She reports it hurts to breathe and sneeze.  Also hurts with movement.  She reports even trying to wipe after a bowel movement is painful No fevers or vomiting.  No anterior chest pain.  She reports shortness of breath due to pain.  No other abdominal pain.  No dysuria.  No weakness is reported in her legs. No incontinence Denies falls or trauma.  No heavy lifting    Past Medical History:  Diagnosis Date  . Abnormal Pap smear    f/u ok  . Anemia   . Anxiety   . Asthma   . Infection   . Mental disorder   . Trichimoniasis     Patient Active Problem List   Diagnosis Date Noted  . Cervical shortening 08/05/2012  . Constipation in pregnancy 07/06/2012  . Hemorrhoids in pregnancy 07/06/2012  . Round ligament pain 07/06/2012  . Hyperemesis gravidarum with electrolyte imbalance 04/01/2012    Past Surgical History:  Procedure Laterality Date  . INDUCED ABORTION    . NO PAST SURGERIES       OB History    Gravida  2   Para  1   Term  1   Preterm      AB  1   Living  1     SAB      IAB  1   Ectopic      Multiple      Live Births  1           Family History  Problem Relation Age of Onset  . Hypertension Mother    . Hyperlipidemia Mother   . Hearing loss Father   . Anesthesia problems Neg Hx   . Other Neg Hx     Social History   Tobacco Use  . Smoking status: Current Every Day Smoker    Packs/day: 0.50    Types: Cigarettes  . Smokeless tobacco: Never Used  Substance Use Topics  . Alcohol use: No  . Drug use: Yes    Types: Marijuana    Comment: occ    Home Medications Prior to Admission medications   Medication Sig Start Date End Date Taking? Authorizing Provider  albuterol (PROVENTIL HFA;VENTOLIN HFA) 108 (90 Base) MCG/ACT inhaler Inhale 1-2 puffs into the lungs every 6 (six) hours as needed for wheezing or shortness of breath. 09/06/17  Yes Dartha Lodge, PA-C  amoxicillin (AMOXIL) 500 MG capsule Take 2 capsules (1,000 mg total) by mouth 2 (two) times daily. 07/06/18   Dione Booze, MD  busPIRone (BUSPAR) 7.5 MG tablet Take 7.5 mg by mouth 2 (two) times daily. 05/29/20   [provider]  dextromethorphan-guaiFENesin (MUCINEX DM) 30-600 MG 12hr tablet Take 1 tablet by mouth 2 (two) times daily as  needed for cough.    [provider]    Allergies    Sulfa antibiotics  Review of Systems   Review of Systems  Constitutional: Negative for fever.  Respiratory: Negative for cough.   Gastrointestinal: Negative for abdominal pain.  Genitourinary: Positive for flank pain. Negative for dysuria.  Neurological: Negative for weakness.  All other systems reviewed and are negative.   Physical Exam Updated Vital Signs BP (!) 138/107   Pulse 85   Temp 98.6 F (37 C) (Oral)   Resp 19   Ht 1.613 m (5' 3.5")   Wt 86.2 kg   SpO2 99%   BMI 33.13 kg/m   Physical Exam CONSTITUTIONAL: Well developed/well nourished HEAD: Normocephalic/atraumatic EYES: EOMI/PERRL ENMT: Mucous membranes moist NECK: supple no meningeal signs SPINE/BACK:entire spine nontender CV: S1/S2 noted, no murmurs/rubs/gallops noted LUNGS: Lungs are clear to auscultation bilaterally, no apparent  distress Posterior chest wall on the right-no crepitus, no bruising, no erythema.  Diffuse tenderness is noted. ABDOMEN: soft, nontender, no rebound or guarding, bowel sounds noted throughout abdomen GU:no cva tenderness NEURO: Pt is awake/alert/appropriate, moves all extremitiesx4.  No facial droop.  No focal weakness is noted in the extremities EXTREMITIES: pulses normal/equal, full ROM SKIN: warm, color normal PSYCH: no abnormalities of mood noted, alert and oriented to situation  ED Results / Procedures / Treatments   Labs (all labs ordered are listed, but only abnormal results are displayed) Labs Reviewed  PREGNANCY, URINE  URINALYSIS, ROUTINE W REFLEX MICROSCOPIC    EKG EKG Interpretation  Date/Time:  Wednesday July 03 2020 02:50:24 EST Ventricular Rate:  97 PR Interval:    QRS Duration: 80 QT Interval:  347 QTC Calculation: 441 R Axis:   13 Text Interpretation: Sinus rhythm Interpretation limited secondary to artifact Confirmed by Zadie Rhine ([id number]) on 07/03/2020 3:09:36 AM   Radiology DG Chest 2 View  Result Date: 07/03/2020 CLINICAL DATA:  Dyspnea EXAM: CHEST - 2 VIEW COMPARISON:  09/06/2017 FINDINGS: The lungs are symmetrically expanded. Mild bibasilar atelectasis. No superimposed confluent pulmonary infiltrate. No pneumothorax or pleural effusion. Cardiac size within normal limits. Pulmonary vascularity is normal. No acute bone abnormality. IMPRESSION: No active cardiopulmonary disease. Electronically Signed   By: Helyn Numbers MD   On: 07/03/2020 03:29    Procedures Procedures   Medications Ordered in ED Medications  HYDROcodone-acetaminophen (NORCO/VICODIN) 5-325 MG per tablet 2 tablet (2 tablets Oral Given 07/03/20 0425)  ketorolac (TORADOL) 30 MG/ML injection 60 mg (60 mg Intramuscular Given 07/03/20 0426)    ED Course  I have reviewed the triage vital signs and the nursing notes.  Pertinent labs & imaging results that were available during  my care of the patient were reviewed by me and considered in my medical decision making (see chart for details).    MDM Rules/Calculators/A&P                          Patient presents with right flank pain for several weeks.  This pain is reproducible on exam.  Chest x-ray is reviewed and is negative.  Will treat pain and reassess 5:33 AM Patient feels much improved.  On repeat exam she has only mild tenderness to palpation and movement.  Her vitals show no hypoxia or tachycardia. At this point my suspicion for PE is low.  No obvious imaging abnormalities. No focal neuro deficits to suggest acute spinal emergency.  Plan will be to treat most likely muscle spasm or strain.  Patient now recalls that she may have done heavy lifting at her job.  Patient reports she has multiple jobs and is raising her child alone so if possible that she strained her back without realizing it Final Clinical Impression(s) / ED Diagnoses Final diagnoses:  Right flank pain  Muscle spasm    Rx / DC Orders ED Discharge Orders         Ordered    HYDROcodone-acetaminophen (NORCO/VICODIN) 5-325 MG tablet  Every 6 hours PRN        07/03/20 0532    ibuprofen (ADVIL) 400 MG tablet  Every 8 hours PRN        07/03/20 0532           Zadie Rhine, MD 07/03/20 [phone]

## 2020-07-03 NOTE — ED Notes (Signed)
EDP at bedside  

## 2020-07-03 NOTE — ED Triage Notes (Signed)
Pt presents from home with shortness of breath and pain on her right side. Pt reports she is unable to take a deep breath because of the pain. Hx of asthma.

## 2021-10-10 ENCOUNTER — Ambulatory Visit (HOSPITAL_COMMUNITY)
Admission: EM | Admit: 2021-10-10 | Discharge: 2021-10-10 | Disposition: A | Discharge status: Home Health | Payer: No Payment, Other | Attending: Behavioral Health | Admitting: Behavioral Health

## 2021-10-10 DIAGNOSIS — Z008 Encounter for other general examination: Secondary | ICD-10-CM | POA: Insufficient documentation

## 2021-10-10 NOTE — ED Triage Notes (Signed)
Pt Amber Fitzpatrick presents to [facility] seeking a mental health evaluation. Pt states that she has a court date on June 14th  and her lawyer needs documentation confirming what she has been diagnosed with. Pt states that her psychiatrist is out of the office due to back surgery and cannot fill out the paperwork. Pt states that she has the documentation but it just needs to be filled out by a provider. Pt states that she is seen at [facility]. Pt states that she has been diagnosed with insomnia, panic disorder, PTSD and a few other diagnoses. Pt appears restless and tired. Pt denies SI/HI and AVH.  ?

## 2021-10-10 NOTE — ED Provider Notes (Signed)
Behavioral Health Urgent Care Medical Screening Exam ? ?Patient Name: Amber Fitzpatrick ?MRN: [id number] ?Date of Evaluation: 10/10/21  ?Diagnosis:  ?Final diagnoses:  ?Encounter for general psychiatric examination without need for care  ? ? ?History of Present illness: Amber Fitzpatrick is a 31 y.o. female patient who presents to the [facility] behavioral health urgent care voluntarily unaccompanied with a chief complaint of seeking a mental health evaluation to have her document signed for her lawyer. ? ?On evaluation, patient is alert and oriented x4. Her thought process is logical and goal oriented.  Her speech is clear and coherent. Her mood is dysphoric and affect is congruent. She appears casually dressed. ? ?Patient states that she was sent here by the nurse practitioner at [facility] to have her [place] document filled out to take back to her lawyer for a mental health hearing on June 14th for disability. Patient states that she was told that the psychiatrist is out on leave and that the nurse practitioner did not feel comfortable signing her paperwork. Patient reports a psychiatric history auditory hallucinations, MDD, PTSD, panic disorder and insomnia. ? ?Patient denies SI/HI/AVH. There is no objective evidence that the patient is currently responding to internal or external stimuli. Patient denies depressive symptoms. Patient reports a fair appetite.  Patient reports fair sleep. Patient denies mental health concerns at this time. ? ?I discussed with the patient that she would need to have [facility] sign the document since she receives medication management and treatment at that facility. Patient states that she has an appointment to return on Monday to see a doctor.  ? ? ? ? ?Psychiatric Specialty Exam ? ?Presentation  ?General Appearance:Appropriate for Environment ? ?Eye Contact:Fair ? ?Speech:Clear and Coherent ? ?Speech  Volume:Normal ? ?Handedness:No data recorded ? ?Mood and Affect  ?Mood:Dysphoric ? ?Affect:Congruent ? ? ?Thought Process  ?Thought Processes:Coherent; Goal Directed ? ?Descriptions of Associations:Intact ? ?Orientation:Full (Time, Place and Person) ? ?Thought Content:Logical ?   Hallucinations:None ? ?Ideas of Reference:None ? ?Suicidal Thoughts:No ? ?Homicidal Thoughts:No ? ? ?Sensorium  ?Memory:Immediate Fair; Remote Fair; Recent Fair ? ?Judgment:Fair ? ?Insight:Fair ? ? ?Executive Functions  ?Concentration:Fair ? ?Attention Span:Fair ? ?Recall:Fair ? ?[place] ? ?Language:Fair ? ? ?Psychomotor Activity  ?Psychomotor Activity:Normal ? ? ?Assets  ?Assets:Communication Skills; Physical Health; Leisure Time; Social Support; Transportation; Financial Resources/Insurance ? ? ?Sleep  ?Sleep:Fair ? ?Number of hours: No data recorded ? ?No data recorded ? ?Physical Exam: ?Physical Exam ?Constitutional:   ?   Appearance: Normal appearance.  ?HENT:  ?   Head: Normocephalic.  ?   Nose: Nose normal.  ?Eyes:  ?   Conjunctiva/sclera: Conjunctivae normal.  ?Cardiovascular:  ?   Rate and Rhythm: Normal rate.  ?Pulmonary:  ?   Effort: Pulmonary effort is normal.  ?Musculoskeletal:     ?   General: Normal range of motion.  ?   Cervical back: Normal range of motion.  ?Neurological:  ?   Mental Status: She is alert and oriented to person, place, and time.  ? ?Review of Systems  ?Constitutional: Negative.   ?HENT: Negative.    ?Eyes: Negative.   ?Respiratory: Negative.    ?Cardiovascular: Negative.   ?Gastrointestinal: Negative.   ?Genitourinary: Negative.   ?Musculoskeletal: Negative.   ?Skin: Negative.   ?Neurological: Negative.   ?Endo/Heme/Allergies: Negative.   ?unknown if currently breastfeeding. There is no height or weight on file to calculate BMI. ? ?Musculoskeletal: ?Strength &  Muscle Tone: within normal limits ?Gait & Station: normal ?Patient leans: N/A ? ? ?[facility] MSE Discharge Disposition for Follow up and  Recommendations: ?Based on my evaluation the patient does not appear to have an emergency medical condition and can be discharged with resources and follow up care in outpatient services for Medication Management, Individual Therapy, and Group Therapy ? ? ?Marissa Calamity, NP ?10/10/2021, 1:01 PM ? ?

## 2021-11-10 ENCOUNTER — Ambulatory Visit (HOSPITAL_COMMUNITY)
Admission: EM | Admit: 2021-11-10 | Discharge: 2021-11-10 | Disposition: A | Discharge status: Home / Self Care | Payer: No Payment, Other | Attending: Psychiatry | Admitting: Psychiatry

## 2021-11-10 DIAGNOSIS — F40240 Claustrophobia: Secondary | ICD-10-CM | POA: Insufficient documentation

## 2021-11-10 DIAGNOSIS — F429 Obsessive-compulsive disorder, unspecified: Secondary | ICD-10-CM | POA: Insufficient documentation

## 2021-11-10 DIAGNOSIS — G4700 Insomnia, unspecified: Secondary | ICD-10-CM | POA: Insufficient documentation

## 2021-11-10 DIAGNOSIS — I10 Essential (primary) hypertension: Secondary | ICD-10-CM | POA: Insufficient documentation

## 2021-11-10 DIAGNOSIS — F411 Generalized anxiety disorder: Secondary | ICD-10-CM | POA: Insufficient documentation

## 2021-11-10 DIAGNOSIS — J45909 Unspecified asthma, uncomplicated: Secondary | ICD-10-CM | POA: Insufficient documentation

## 2021-11-10 DIAGNOSIS — F4312 Post-traumatic stress disorder, chronic: Secondary | ICD-10-CM | POA: Insufficient documentation

## 2021-11-10 DIAGNOSIS — Z79899 Other long term (current) drug therapy: Secondary | ICD-10-CM | POA: Insufficient documentation

## 2021-11-10 DIAGNOSIS — R519 Headache, unspecified: Secondary | ICD-10-CM | POA: Insufficient documentation

## 2021-11-10 DIAGNOSIS — Z818 Family history of other mental and behavioral disorders: Secondary | ICD-10-CM | POA: Insufficient documentation

## 2021-11-10 DIAGNOSIS — R440 Auditory hallucinations: Secondary | ICD-10-CM | POA: Insufficient documentation
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
## 2021-11-10 NOTE — Progress Notes (Signed)
?   11/10/21 0854  ?[facility] Triage Screening (Walk-ins at [facility] only)  ?What Is the Reason for Your Visit/Call Today? Paitent presents reporting she is feeling "stressed and I need a psychiatrist."  Patient is followed by Dr. Ezzard Standing of [facility], however he has been out on leave due to an injury and they haven't referred her to another provider.  She reports she takes Rx seroquel and xanax as prescribed.  She shares she is taking 1mg  xanax three times per day and it doesn't seem to be relieving anxiety.  She would like to discuss current med regimen with a provider.  She would also like referrals for psychiatrists and is open to coming to [facility].  Patient is engaged in therapy with [facility], LCSW of [facility].  Patient denies SI, HI, AVH or SA hx.  ?How Long Has This Been Causing You Problems? 1 wk - 1 month  ?Have You Recently Had Any Thoughts About Hurting Yourself? No  ?Are You Planning to Commit Suicide/Harm Yourself At This time? No  ?Have you Recently Had Thoughts About Hurting Someone Verdene Lennert? No  ?Are You Planning To Harm Someone At This Time? No  ?Are you currently experiencing any auditory, visual or other hallucinations? No  ?Have You Used Any Alcohol or Drugs in the Past 24 Hours? No  ?Do you have any current medical co-morbidities that require immediate attention? No  ?Clinician description of patient physical appearance/behavior: Patient reports feeling anxious, appears calm and is cooperative AAOx5  ?What Do You Feel Would Help You the Most Today? Medication(s)  ?If access to [facility] was not available, would you have sought care in the Emergency Department? No  ?Determination of Need Routine (7 days)  ?Options For Referral Medication Management  ? ? ?

## 2021-11-10 NOTE — Discharge Instructions (Addendum)
?  Please come to [facility] (this facility) during walk in hours for appointment with psychiatrist/provider for further medication management and for therapists for therapy.  ? ? Walk-Ins for medication management  are available on Monday, Wednesday, Thursday and Friday from 8am-11am.  It is first come, first -serve; it is best to arrive by 7:00 AM.  ? ? Walk-Ins for therapy are  available on Monday and Wednesday?s  8am-11am.  It is first come, first -serve; it is best to arrive by 7:00 AM.  ? ? ?When you arrive please go upstairs for your appointment. If you are unsure of where to go, inform the front desk that you are here for a walk in appointment and they will assist you with directions upstairs. ? ?Address:  ?[address], in [place], [address] ?Ph: [phone]  ? ? ? ? ? ?

## 2021-11-10 NOTE — Discharge Summary (Signed)
[name] to be D/C'd Home per MD order. Discussed with the patient and all questions fully answered. An After Visit Summary was printed and given to the patient. Patient escorted out, and D/C home via private auto.  ?Milburn Freeney  Marquis Lunch  ?11/10/2021 10:52 AM ?  ?   ?

## 2021-11-12 ENCOUNTER — Ambulatory Visit (INDEPENDENT_AMBULATORY_CARE_PROVIDER_SITE_OTHER): Payer: No Payment, Other | Admitting: Physician Assistant

## 2021-11-12 ENCOUNTER — Encounter (HOSPITAL_COMMUNITY): Payer: Self-pay | Admitting: Physician Assistant

## 2021-11-12 VITALS — BP 127/95 | HR 72 | Ht 63.5 in | Wt 200.0 lb

## 2021-11-12 DIAGNOSIS — F4310 Post-traumatic stress disorder, unspecified: Secondary | ICD-10-CM | POA: Diagnosis not present

## 2021-11-12 DIAGNOSIS — F315 Bipolar disorder, current episode depressed, severe, with psychotic features: Secondary | ICD-10-CM | POA: Diagnosis not present

## 2021-11-12 DIAGNOSIS — F4001 Agoraphobia with panic disorder: Secondary | ICD-10-CM | POA: Diagnosis not present

## 2021-11-12 DIAGNOSIS — F319 Bipolar disorder, unspecified: Secondary | ICD-10-CM | POA: Insufficient documentation

## 2021-11-12 DIAGNOSIS — G4700 Insomnia, unspecified: Secondary | ICD-10-CM | POA: Diagnosis not present
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
## 2021-12-16 ENCOUNTER — Ambulatory Visit (HOSPITAL_COMMUNITY): Payer: No Payment, Other | Admitting: Clinical

## 2021-12-23 ENCOUNTER — Encounter (HOSPITAL_COMMUNITY): Payer: Self-pay | Admitting: Physician Assistant

## 2021-12-23 ENCOUNTER — Ambulatory Visit (INDEPENDENT_AMBULATORY_CARE_PROVIDER_SITE_OTHER): Payer: No Payment, Other | Admitting: Physician Assistant

## 2021-12-23 VITALS — BP 129/88 | HR 63 | Ht 63.5 in | Wt 194.0 lb

## 2021-12-23 DIAGNOSIS — G4700 Insomnia, unspecified: Secondary | ICD-10-CM | POA: Diagnosis not present

## 2021-12-23 DIAGNOSIS — F4001 Agoraphobia with panic disorder: Secondary | ICD-10-CM | POA: Diagnosis not present

## 2021-12-23 DIAGNOSIS — F4310 Post-traumatic stress disorder, unspecified: Secondary | ICD-10-CM | POA: Diagnosis not present

## 2021-12-23 DIAGNOSIS — F315 Bipolar disorder, current episode depressed, severe, with psychotic features: Secondary | ICD-10-CM

## 2021-12-26 ENCOUNTER — Encounter (HOSPITAL_COMMUNITY): Payer: Self-pay | Admitting: Physician Assistant

## 2021-12-26 MED ORDER — TRAZODONE HCL 100 MG PO TABS: 100.0000 mg | ORAL_TABLET | Freq: Every day | ORAL | 1 refills | Status: DC

## 2021-12-26 MED ORDER — QUETIAPINE FUMARATE 200 MG PO TABS: 200.0000 mg | ORAL_TABLET | Freq: Two times a day (BID) | ORAL | 2 refills | Status: DC

## 2021-12-26 MED ORDER — BUSPIRONE HCL 7.5 MG PO TABS: 7.5000 mg | ORAL_TABLET | Freq: Two times a day (BID) | ORAL | 2 refills | Status: DC
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
## 2022-01-07 ENCOUNTER — Other Ambulatory Visit (HOSPITAL_COMMUNITY): Payer: Self-pay | Admitting: Physician Assistant

## 2022-01-07 ENCOUNTER — Telehealth (HOSPITAL_COMMUNITY): Payer: Self-pay | Admitting: *Deleted

## 2022-01-07 DIAGNOSIS — F4001 Agoraphobia with panic disorder: Secondary | ICD-10-CM

## 2022-01-07 NOTE — Telephone Encounter (Signed)
Fax request from her pharmacy for a rx of her xanax to be sent in. She was seen on June 6,23 and has a future appt scheduled.The MAR is incomplete, there is no provider name of refills but it was last written on 10/22/21. Will forward this request to the provider for his consideration.

## 2022-01-07 NOTE — Telephone Encounter (Signed)
Provider was contacted by Orpah Clinton. Reola Calkins, RN regarding request from patient's pharmacy for Xanax refill.  Provider to contact patient to discuss Xanax use and to determine if refills are necessary at this time.

## 2022-01-08 ENCOUNTER — Telehealth (HOSPITAL_COMMUNITY): Payer: Self-pay | Admitting: *Deleted

## 2022-01-08 ENCOUNTER — Other Ambulatory Visit (HOSPITAL_COMMUNITY): Payer: Self-pay | Admitting: Physician Assistant

## 2022-01-08 DIAGNOSIS — F4001 Agoraphobia with panic disorder: Secondary | ICD-10-CM

## 2022-01-08 MED ORDER — ALPRAZOLAM 1 MG PO TABS: 1.0000 mg | ORAL_TABLET | Freq: Three times a day (TID) | ORAL | 0 refills | Status: DC | PRN

## 2022-01-08 NOTE — Progress Notes (Signed)
Provider was contacted by Direce E. McIntyre, RMA regarding patient's request for Xanax refill.  Patient has a history of taking Xanax from her previous provider.  Patient's medication to be prescribed to pharmacy of choice.

## 2022-01-08 NOTE — Telephone Encounter (Signed)
Patient Called Again Today LVM Requesting Refill ALPRAZolam (XANAX) 1 MG tablet which looks as though Historical provider managed.

## 2022-01-08 NOTE — Telephone Encounter (Signed)
Provider was contacted by Rushie Chestnut, RMA regarding patient's request for Xanax refill.  Patient has a history of taking Xanax from her previous provider.  Patient's medication to be prescribed to pharmacy of choice.

## 2022-02-24 ENCOUNTER — Ambulatory Visit (INDEPENDENT_AMBULATORY_CARE_PROVIDER_SITE_OTHER): Payer: No Payment, Other | Admitting: Physician Assistant

## 2022-02-24 ENCOUNTER — Encounter (HOSPITAL_COMMUNITY): Payer: Self-pay | Admitting: Physician Assistant

## 2022-02-24 DIAGNOSIS — G4700 Insomnia, unspecified: Secondary | ICD-10-CM

## 2022-02-24 DIAGNOSIS — F4310 Post-traumatic stress disorder, unspecified: Secondary | ICD-10-CM

## 2022-02-24 DIAGNOSIS — F4001 Agoraphobia with panic disorder: Secondary | ICD-10-CM

## 2022-02-24 DIAGNOSIS — F315 Bipolar disorder, current episode depressed, severe, with psychotic features: Secondary | ICD-10-CM | POA: Diagnosis not present

## 2022-02-24 MED ORDER — TRAZODONE HCL 100 MG PO TABS: 100.0000 mg | ORAL_TABLET | Freq: Every day | ORAL | 2 refills | Status: DC

## 2022-02-24 MED ORDER — QUETIAPINE FUMARATE 200 MG PO TABS: 200.0000 mg | ORAL_TABLET | Freq: Two times a day (BID) | ORAL | 2 refills | Status: DC

## 2022-02-25 ENCOUNTER — Encounter (HOSPITAL_COMMUNITY): Payer: Self-pay | Admitting: Physician Assistant

## 2022-02-25 NOTE — Progress Notes (Signed)
BH MD/PA/NP OP Progress Note  02/25/2022 8:34 AM Amber Fitzpatrick Amber Fitzpatrick  MRN:  [id number]  Chief Complaint:  Chief Complaint  Patient presents with   Medication Management   HPI:   Amber Fitzpatrick is a 31 year old female with a past psychiatric history significant for PTSD, panic disorder with agoraphobia, bipolar disorder, and insomnia who presents to [facility] for follow-up and medication management.  Patient is currently being managed on the following medications:  Seroquel 200 mg 2 times daily Alprazolam 1 mg 3 times daily as needed Trazodone 100 mg at bedtime  Although patient endorses exhaustion, she reports that she has been doing much better.  She states that the voices in her head have been more clear and not as distracting.  She describes the voices as having the ability to foretell the future.  She states that on occasion, the voices are difficult to deal with but she has been able to manage as of late.  When hearing voices, patient describes the feeling as watching a movie.  Although some of her symptoms continue to persist, patient states that her medications have been helpful.  Patient notes that she has been experiencing some vomiting when taking Seroquel.  Patient continues her medications as prescribed.  She continues to endorse some depression related to paperwork that she is waiting on related to [place].  Patient endorses anxiety and rates her anxiety at 10 out of 10.  Patient attributes her anxiety to people in her life trying to be sore both physically and mentally.  Patient describes herself as having a low social battery and does not interact with people much.  Patient denies any new stressors at this time.  A PHQ-9 screen was performed with the patient scoring an 18.  A GAD-7 screen was also performed with the patient scoring a 20.  Patient is alert and oriented x4, calm, cooperative, and fully engaged in conversation during the  encounter.  Patient describes her mood as being anxious and worried at this time.  Patient denies suicidal or homicidal ideations.  Patient continues to endorse auditory hallucinations characterized by voices that foretell the future.  Patient states that on occasion, her voices will inform her that she must brace herself to prepare for great loss.  Due to her auditory hallucinations, patient finds herself paying attention to her surroundings excessively.  Patient denies visual hallucinations and does not appear to be responding to internal/external stimuli.  Patient endorses mostly good sleep and receives on average 8 hours of sleep each night.  Patient states that she has been experiencing less sleep due to having to watch over her sick child at night.  Patient endorses decreased appetite and eats on average 1 meal along with a snack per day.  Patient denies alcohol consumption, tobacco use, and illicit drug use.  Visit Diagnosis:    ICD-10-CM   1. Panic disorder with agoraphobia  F40.01     2. Insomnia, unspecified type  G47.00 traZODone (DESYREL) 100 MG tablet    3. PTSD (post-traumatic stress disorder)  F43.10 QUEtiapine (SEROQUEL) 200 MG tablet    4. Bipolar disorder, current episode depressed, severe, with psychotic features ([place])  F31.5 QUEtiapine (SEROQUEL) 200 MG tablet      Past Psychiatric History:  PTSD Panic disorder with agoraphobia Bipolar disorder Insomnia  Past Medical History:  Past Medical History:  Diagnosis Date   Abnormal Pap smear    f/u ok   Anemia    Anxiety  Asthma    Infection    Mental disorder    Trichimoniasis     Past Surgical History:  Procedure Laterality Date   INDUCED ABORTION     NO PAST SURGERIES      Family Psychiatric History:  Great uncle (maternal) - Schizophrenia Great aunt (maternal) - Schizophrenia  Family History:  Family History  Problem Relation Age of Onset   Hypertension Mother    Hyperlipidemia Mother    Hearing loss  Father    Anesthesia problems Neg Hx    Other Neg Hx     Social History:  Social History   Socioeconomic History   Marital status: Single    Spouse name: Not on file   Number of children: Not on file   Years of education: Not on file   Highest education level: Not on file  Occupational History   Not on file  Tobacco Use   Smoking status: Every Day    Packs/day: 0.50    Types: Cigarettes   Smokeless tobacco: Never  Substance and Sexual Activity   Alcohol use: No   Drug use: Yes    Types: Marijuana    Comment: occ   Sexual activity: Yes    Birth control/protection: None  Other Topics Concern   Not on file  Social History Narrative   Not on file   Social Determinants of Health   Financial Resource Strain: Not on file  Food Insecurity: Not on file  Transportation Needs: Not on file  Physical Activity: Not on file  Stress: Not on file  Social Connections: Not on file    Allergies:  Allergies  Allergen Reactions   Sulfa Antibiotics Rash    Metabolic Disorder Labs: No results found for: "HGBA1C", "MPG" No results found for: "PROLACTIN" No results found for: "CHOL", "TRIG", "HDL", "CHOLHDL", "VLDL", "LDLCALC" No results found for: "TSH"  Therapeutic Level Labs: No results found for: "LITHIUM" No results found for: "VALPROATE" No results found for: "CBMZ"  Current Medications: Current Outpatient Medications  Medication Sig Dispense Refill   albuterol (PROVENTIL HFA;VENTOLIN HFA) 108 (90 Base) MCG/ACT inhaler Inhale 1-2 puffs into the lungs every 6 (six) hours as needed for wheezing or shortness of breath. 1 Inhaler 0   ALPRAZolam (XANAX) 1 MG tablet Take 1 tablet (1 mg total) by mouth 3 (three) times daily as needed. 90 tablet 0   busPIRone (BUSPAR) 7.5 MG tablet Take 1 tablet (7.5 mg total) by mouth 2 (two) times daily. 60 tablet 2   HYDROcodone-acetaminophen (NORCO/VICODIN) 5-325 MG tablet Take 1 tablet by mouth every 6 (six) hours as needed. 6 tablet 0    ibuprofen (ADVIL) 400 MG tablet Take 1 tablet (400 mg total) by mouth every 8 (eight) hours as needed for moderate pain. 21 tablet 0   QUEtiapine (SEROQUEL) 200 MG tablet Take 1 tablet (200 mg total) by mouth 2 (two) times daily. 60 tablet 2   traZODone (DESYREL) 100 MG tablet Take 1 tablet (100 mg total) by mouth daily. 30 tablet 2   No current facility-administered medications for this visit.     Musculoskeletal: Strength & Muscle Tone: within normal limits Gait & Station: normal Patient leans: N/A  Psychiatric Specialty Exam: Review of Systems  Psychiatric/Behavioral:  Positive for hallucinations and sleep disturbance. Negative for decreased concentration, dysphoric mood, self-injury and suicidal ideas. The patient is nervous/anxious. The patient is not hyperactive.     Blood pressure (!) 119/90, pulse 90, height 5' 3.5" (1.613 m), weight 191 lb (  86.6 kg), unknown if currently breastfeeding.Body mass index is 33.3 kg/m.  General Appearance: Casual  Eye Contact:  Good  Speech:  Clear and Coherent and Normal Rate  Volume:  Normal  Mood:  Anxious and Depressed  Affect:  Congruent  Thought Process:  Coherent and Descriptions of Associations: Intact  Orientation:  Full (Time, Place, and Person)  Thought Content: WDL   Suicidal Thoughts:  No  Homicidal Thoughts:  No  Memory:  Immediate;   Good Recent;   Good Remote;   Good  Judgement:  Good  Insight:  Fair  Psychomotor Activity:  Normal  Concentration:  Concentration: Good and Attention Span: Good  Recall:  Good  Fund of Knowledge: Good  Language: Good  Akathisia:  No  Handed:  Right  AIMS (if indicated): not done  Assets:  Communication Skills Desire for Improvement Financial Resources/Insurance Housing Resilience Social Support Transportation  ADL's:  Intact  Cognition: WNL  Sleep:  Fair   Screenings: GAD-7    Flowsheet Row Clinical Support from 02/24/2022 in [facility] Office Visit  from 12/23/2021 in [facility] Office Visit from 11/12/2021 in [facility]  Total GAD-7 Score 20 18 21       PHQ2-9    Flowsheet Row Clinical Support from 02/24/2022 in [facility] Office Visit from 12/23/2021 in [facility] Office Visit from 11/12/2021 in [place]  PHQ-2 Total Score 3 5 6   PHQ-9 Total Score 18 22 23       Flowsheet Row Clinical Support from 02/24/2022 in [facility] Office Visit from 12/23/2021 in [facility] Office Visit from 11/12/2021 in [place] No Risk No Risk No Risk        Assessment and Plan:   Nell Milbourne. Bissett is a 31 year old female with a past psychiatric history significant for PTSD, panic disorder with agoraphobia, bipolar disorder, and insomnia who presents to [facility] for follow-up and medication management.  Patient presents to the clinic stating that she still continues to experience visual hallucinations but states that the voices she experiences are manageable.  Patient endorses some depression related to paperwork regarding SSI that she is waiting to get approved.  Patient also notes having elevated anxiety when interacting with people outside of her home.  Although patient continues to experience some mental health related symptoms, she denies the need for dosage adjustments on her medications at this time and states that she is stable on her current meds.  Since medications to be prescribed to pharmacy of choice.  Collaboration of Care: Collaboration of Care: Medication Management AEB provider managing patient's psychiatric medications, Primary Care Provider AEB patient being seen by a primary care provider at [facility], Psychiatrist AEB patient being  followed by mental health provider, and Referral or follow-up with counselor/therapist AEB patient being seen by a therapist at [facility]  Patient/Guardian was advised Release of Information must be obtained prior to any record release in order to collaborate their care with an outside provider. Patient/Guardian was advised if they have not already done so to contact the registration department to sign all necessary forms in order for [place] to release information regarding their care.   Consent: Patient/Guardian gives verbal consent for treatment and assignment of benefits for services provided during this visit. Patient/Guardian expressed understanding and agreed  to proceed.   1. Panic disorder with agoraphobia Patient to continue taking alprazolam 1 mg 3 times daily as needed for the management of her panic disorder with agoraphobia  2. Insomnia, unspecified type  - traZODone (DESYREL) 100 MG tablet; Take 1 tablet (100 mg total) by mouth daily.  Dispense: 30 tablet; Refill: 2  3. PTSD (post-traumatic stress disorder)  - QUEtiapine (SEROQUEL) 200 MG tablet; Take 1 tablet (200 mg total) by mouth 2 (two) times daily.  Dispense: 60 tablet; Refill: 2  4. Bipolar disorder, current episode depressed, severe, with psychotic features (HCC)  - QUEtiapine (SEROQUEL) 200 MG tablet; Take 1 tablet (200 mg total) by mouth 2 (two) times daily.  Dispense: 60 tablet; Refill: 2  Patient to follow up in 2 months Provider spent a total of 13 minutes with the patient/reviewing patient's chart  Meta Hatchet, PA 02/25/2022, 8:34 AM

## 2022-03-27 ENCOUNTER — Other Ambulatory Visit (HOSPITAL_COMMUNITY): Payer: Self-pay | Admitting: Physician Assistant

## 2022-03-27 DIAGNOSIS — F4001 Agoraphobia with panic disorder: Secondary | ICD-10-CM

## 2022-04-01 ENCOUNTER — Telehealth (HOSPITAL_COMMUNITY): Payer: Self-pay | Admitting: Psychiatry

## 2022-04-01 ENCOUNTER — Other Ambulatory Visit (HOSPITAL_COMMUNITY): Payer: Self-pay | Admitting: Physician Assistant

## 2022-04-01 DIAGNOSIS — F4001 Agoraphobia with panic disorder: Secondary | ICD-10-CM

## 2022-04-01 MED ORDER — ALPRAZOLAM 1 MG PO TABS: 1.0000 mg | ORAL_TABLET | Freq: Three times a day (TID) | ORAL | 0 refills | Status: DC | PRN

## 2022-04-01 NOTE — Progress Notes (Signed)
Patient is needing a refill of her Alprazolam prescription. Patient's medication to be e-prescribed to pharmacy of choice.

## 2022-04-28 ENCOUNTER — Encounter (HOSPITAL_COMMUNITY): Payer: No Payment, Other | Admitting: Physician Assistant

## 2022-05-20 ENCOUNTER — Encounter (HOSPITAL_COMMUNITY): Payer: No Payment, Other | Admitting: Psychiatry

## 2022-05-20 ENCOUNTER — Ambulatory Visit (HOSPITAL_COMMUNITY): Payer: No Payment, Other | Admitting: Mental Health

## 2022-05-26 ENCOUNTER — Other Ambulatory Visit (HOSPITAL_COMMUNITY): Payer: Self-pay | Admitting: Physician Assistant

## 2022-05-26 ENCOUNTER — Telehealth (HOSPITAL_COMMUNITY): Payer: Self-pay

## 2022-05-26 DIAGNOSIS — F4001 Agoraphobia with panic disorder: Secondary | ICD-10-CM

## 2022-05-26 MED ORDER — ALPRAZOLAM 1 MG PO TABS: 1.0000 mg | ORAL_TABLET | Freq: Three times a day (TID) | ORAL | 0 refills | Status: DC | PRN

## 2022-05-26 NOTE — Telephone Encounter (Signed)
Pt missed last appt due to in-person/virtual appt confusion. She has rescheduled for 06/01/22, however is out of medication. She is requesting a refill until her next appt. She was last seen in August 2023 by Eddie. [facility]- [address], [place]. 

## 2022-05-26 NOTE — Progress Notes (Signed)
Provider was contacted by Armando Reichert, MD requesting that patient's medication be filled. Patient's next appointment is with Dr. Rosita Kea on 06/01/2022. Patient's medication to be e-prescribed to pharmacy of choice.

## 2022-05-26 NOTE — Telephone Encounter (Signed)
Pt missed last appt due to in-person/virtual appt confusion. She has rescheduled for 06/01/22, however is out of medication. She is requesting a refill until her next appt. She was last seen in August 2023 by Ludwig Clarks. [place].

## 2022-05-26 NOTE — Telephone Encounter (Signed)
Provider was contacted by Amber Doda, MD requesting that patient's medication be filled. Patient's next appointment is with Dr. Doda on 06/01/2022. Patient's medication to be e-prescribed to pharmacy of choice. 

## 2022-06-01 ENCOUNTER — Ambulatory Visit (INDEPENDENT_AMBULATORY_CARE_PROVIDER_SITE_OTHER): Payer: No Payment, Other | Admitting: Psychiatry

## 2022-06-01 DIAGNOSIS — F315 Bipolar disorder, current episode depressed, severe, with psychotic features: Secondary | ICD-10-CM | POA: Diagnosis not present

## 2022-06-01 DIAGNOSIS — F4310 Post-traumatic stress disorder, unspecified: Secondary | ICD-10-CM | POA: Diagnosis not present

## 2022-06-01 MED ORDER — QUETIAPINE FUMARATE 400 MG PO TABS: 400.0000 mg | ORAL_TABLET | Freq: Every day | ORAL | 2 refills | Status: DC

## 2022-06-01 MED ORDER — QUETIAPINE FUMARATE 100 MG PO TABS: 100.0000 mg | ORAL_TABLET | Freq: Every day | ORAL | 2 refills | Status: DC

## 2022-06-01 NOTE — Progress Notes (Signed)
BH MD/PA/NP OP Progress Note  06/02/2022 8:55 AM Amber Fitzpatrick  MRN:  [id number]  Chief Complaint:  Chief Complaint  Patient presents with   Anxiety   Follow-up   HPI:   Amber Fitzpatrick is a 31 year old female with a past psychiatric history significant for PTSD, panic disorder with agoraphobia, bipolar disorder, and insomnia who presents to [facility] for follow-up and medication management.  Patient is currently being managed on the following medications:  Seroquel 200 mg 2 times daily Alprazolam 1 mg 3 times daily as needed Trazodone 100 mg at bedtime PDMP checked and patient last refilled alprazolam 30-day supply on 05/26/2022.  Pt reports that her mood is irritable because she applied for disability and her disability was rejected recently.  She reports that she gets anxious and gets multiple panic attacks in a day.  She isolates and does not go out to avoid any panic attacks except going to church.  She reports that whenever she goes to church, she cannot stay more than 45 minutes otherwise she would have a panic attack.  She reports that she takes Xanax only when she really needs it which ranges from once on a good day to 3 times on a bad day.  Educated patient on dependence, abuse and resistance with benzodiazepines.  She reports that she never abuses Xanax and only refills it once in 2-3 months.  She reports that she needs Xanax because of her past trauma and panic attacks.  She reports that if she does not take her Xanax she needs to go to the hospital due to elevated heart rate and panic attack.  She reports that she is always worried about money and not being able to provide for her daughter.    She reports that she is not able to sleep well at night and wakes up multiple times.  She gets 2 to 6 hours of sleep at night.  She feels sedated during the daytime due to daytime Seroquel.  Currently, she denies any suicidal ideations,  homicidal ideations and visual hallucinations.  She reports auditory hallucinations of hearing voices saying  "what is coming next ?".  She denies command hallucinations.  She reports that with Seroquel the voices are not as loud as before.  She reports paranoia and constantly thinks that something bad would happen to her and her daughter. She reports that she is not able to work because of her paranoia and agoraphobia.  She has been tolerating her medications well but reports sedation during daytime.  She is currently unemployed  and lives with her daughter (49-year-old). She denies drinking alcohol  and denies using any illicit drugs. She does not smoke cigarettes.  Discussed decreasing morning Seroquel and increasing the nightly Seroquel to help with sleep.  She agrees with the plan.    Patient is alert and oriented x 4, anxious, cooperative, and fully engaged in conversation during the encounter.  Her thought process is linear with coherent speech . She does not appear to be responding to internal/external stimuli .    Visit Diagnosis:    ICD-10-CM   1. PTSD (post-traumatic stress disorder)  F43.10 QUEtiapine (SEROQUEL) 100 MG tablet    2. Bipolar disorder, current episode depressed, severe, with psychotic features (HCC)  F31.5 QUEtiapine (SEROQUEL) 100 MG tablet    QUEtiapine (SEROQUEL) 400 MG tablet      Past Psychiatric History:  PTSD Panic disorder with agoraphobia Bipolar disorder Insomnia  Past Medical History:  Past Medical History:  Diagnosis Date   Abnormal Pap smear    f/u ok   Anemia    Anxiety    Asthma    Infection    Mental disorder    Trichimoniasis     Past Surgical History:  Procedure Laterality Date   INDUCED ABORTION     NO PAST SURGERIES      Family Psychiatric History:  Great uncle (maternal) - Schizophrenia Great aunt (maternal) - Schizophrenia  Family History:  Family History  Problem Relation Age of Onset   Hypertension Mother     Hyperlipidemia Mother    Hearing loss Father    Anesthesia problems Neg Hx    Other Neg Hx     Social History:  Social History   Socioeconomic History   Marital status: Single    Spouse name: Not on file   Number of children: Not on file   Years of education: Not on file   Highest education level: Not on file  Occupational History   Not on file  Tobacco Use   Smoking status: Every Day    Packs/day: 0.50    Types: Cigarettes   Smokeless tobacco: Never  Substance and Sexual Activity   Alcohol use: No   Drug use: Yes    Types: Marijuana    Comment: occ   Sexual activity: Yes    Birth control/protection: None  Other Topics Concern   Not on file  Social History Narrative   Not on file   Social Determinants of Health   Financial Resource Strain: Not on file  Food Insecurity: Not on file  Transportation Needs: Not on file  Physical Activity: Not on file  Stress: Not on file  Social Connections: Not on file    Allergies:  Allergies  Allergen Reactions   Sulfa Antibiotics Rash    Metabolic Disorder Labs: No results found for: "HGBA1C", "MPG" No results found for: "PROLACTIN" No results found for: "CHOL", "TRIG", "HDL", "CHOLHDL", "VLDL", "LDLCALC" No results found for: "TSH"  Therapeutic Level Labs: No results found for: "LITHIUM" No results found for: "VALPROATE" No results found for: "CBMZ"  Current Medications: Current Outpatient Medications  Medication Sig Dispense Refill   QUEtiapine (SEROQUEL) 400 MG tablet Take 1 tablet (400 mg total) by mouth at bedtime. 30 tablet 2   albuterol (PROVENTIL HFA;VENTOLIN HFA) 108 (90 Base) MCG/ACT inhaler Inhale 1-2 puffs into the lungs every 6 (six) hours as needed for wheezing or shortness of breath. 1 Inhaler 0   ALPRAZolam (XANAX) 1 MG tablet Take 1 tablet (1 mg total) by mouth 3 (three) times daily as needed. 90 tablet 0   HYDROcodone-acetaminophen (NORCO/VICODIN) 5-325 MG tablet Take 1 tablet by mouth every 6 (six)  hours as needed. 6 tablet 0   ibuprofen (ADVIL) 400 MG tablet Take 1 tablet (400 mg total) by mouth every 8 (eight) hours as needed for moderate pain. 21 tablet 0   QUEtiapine (SEROQUEL) 100 MG tablet Take 1 tablet (100 mg total) by mouth daily. 30 tablet 2   traZODone (DESYREL) 100 MG tablet Take 1 tablet (100 mg total) by mouth daily. 30 tablet 2   No current facility-administered medications for this visit.     Musculoskeletal: Strength & Muscle Tone: within normal limits Gait & Station: normal Patient leans: N/A  Psychiatric Specialty Exam: Review of Systems  Psychiatric/Behavioral:  Positive for hallucinations and sleep disturbance. Negative for decreased concentration, dysphoric mood, self-injury and suicidal ideas. The patient is nervous/anxious. The patient  is not hyperactive.     Blood pressure 113/88, pulse 67, SpO2 100 %, unknown if currently breastfeeding.There is no height or weight on file to calculate BMI.  General Appearance: Casual  Eye Contact:  Good  Speech:  Clear and Coherent and Normal Rate  Volume:  Normal  Mood:  Anxious  Affect:  Constricted  Thought Process:  Coherent and Descriptions of Associations: Intact  Orientation:  Full (Time, Place, and Person)  Thought Content: Hallucinations: Auditory   Suicidal Thoughts:  No  Homicidal Thoughts:  No  Memory:  Immediate;   Good Recent;   Good Remote;   Good  Judgement:  Fair  Insight:  Fair  Psychomotor Activity:  Normal  Concentration:  Concentration: Good and Attention Span: Good  Recall:  Good  Fund of Knowledge: Good  Language: Good  Akathisia:  No  Handed:  Right  AIMS (if indicated): not done  Assets:  Communication Skills Desire for Improvement Financial Resources/Insurance Housing Resilience Social Support Transportation  ADL's:  Intact  Cognition: WNL  Sleep:  Fair   Screenings: GAD-7    Garment/textile technologistlowsheet Row [facility]  [facility]  Total GAD-7 Score 20 18 21       PHQ2-9    Flowsheet Row [facility]  PHQ-2 Total Score 3 5 6   PHQ-9 Total Score 18 22 23       Flowsheet Row [facility]  C-SSRS RISK CATEGORY No Risk No Risk No Risk        Assessment and Plan:  Darlin PriestlyLaquita M. Roxan Fitzpatrick is a 31 year old female with a past psychiatric history significant for PTSD, panic disorder with agoraphobia, bipolar disorder, and insomnia [facility] and medication management.  Patient is reporting high anxiety with frequent panic attacks.  She is taking Xanax 1-3 times in a day for panic attacks. Educated patient about abuse, dependence and resistance of benzodiazepines.  She is also reporting poor sleep and sedation during the daytime due to daytime Seroquel.  Will decrease daytime Seroquel due to sedation and increase nightly Seroquel to help with sleep, depression and hallucinations.   Panic disorder with agoraphobia Patient to continue taking alprazolam 1 mg 3 times daily as needed for the management of her panic disorder with agoraphobia. Pt already has refill.  PDMP checked and patient last refilled alprazolam 30-day supply on 05/26/2022.   Insomnia, unspecified type  - traZODone (DESYREL) 100 MG tablet; Take 1 tablet (100 mg total) by mouth daily.  Dispense: 30 tablet; Refill: 2  Bipolar disorder, current episode depressed, severe,  with psychotic features (HCC) PTSD (post-traumatic stress disorder)  - Reduce daytime QUEtiapine (SEROQUEL) to 100 mg daily due to sedation. 30-day prescription with 2 refills sent to patient's pharmacy.  - Increase nightly Seroquel to 400 mg QHS to help with psychosis, depression and sleep.  30-day prescription with 2 refills sent to patient's pharmacy  Collaboration of Care: Collaboration of Care: Dr Josephina ShihBahraini  Patient/Guardian was advised Release of Information must be obtained prior  to any record release in order to collaborate their care with an outside provider. Patient/Guardian was advised if they have not already done so to contact the registration department to sign all necessary forms in order for [place] to release information regarding their care.   Consent: Patient/Guardian gives verbal consent for treatment and assignment of benefits for services provided during this visit. Patient/Guardian expressed understanding and agreed to proceed.   Patient to follow up in 4 weeks. Provider spent a total of 20 minutes with the patient/reviewing patient's chart  Karsten Ro, MD 06/02/2022, 8:55 AM

## 2022-06-01 NOTE — Patient Instructions (Signed)
Follow-up in 4 weeks

## 2022-06-03 ENCOUNTER — Encounter (HOSPITAL_COMMUNITY): Payer: Self-pay

## 2022-06-03 ENCOUNTER — Ambulatory Visit (INDEPENDENT_AMBULATORY_CARE_PROVIDER_SITE_OTHER): Payer: No Payment, Other | Admitting: Mental Health

## 2022-06-03 DIAGNOSIS — F4310 Post-traumatic stress disorder, unspecified: Secondary | ICD-10-CM

## 2022-06-03 DIAGNOSIS — F4001 Agoraphobia with panic disorder: Secondary | ICD-10-CM
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
## 2022-06-04 NOTE — Plan of Care (Signed)
  Problem: Anxiety Disorder CCP Problem  1 PTSD GAD Goal: LTG: Buna will score less than 5 on the Generalized Anxiety Disorder 7 Scale (GAD-7) Outcome: Initial Goal: STG: Casidy will increase management of anxiety/stress AEB development of x 3 effective coping skills with ability to reframe maladaptive thinking patterns daily within the next 6 months  Outcome: Initial

## 2022-06-23 ENCOUNTER — Ambulatory Visit (INDEPENDENT_AMBULATORY_CARE_PROVIDER_SITE_OTHER): Payer: No Payment, Other | Admitting: Mental Health

## 2022-06-23 ENCOUNTER — Encounter (HOSPITAL_COMMUNITY): Payer: Self-pay

## 2022-06-23 DIAGNOSIS — F4310 Post-traumatic stress disorder, unspecified: Secondary | ICD-10-CM

## 2022-06-23 DIAGNOSIS — F315 Bipolar disorder, current episode depressed, severe, with psychotic features: Secondary | ICD-10-CM
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
## 2022-06-29 ENCOUNTER — Encounter (HOSPITAL_COMMUNITY): Payer: No Payment, Other | Admitting: Psychiatry

## 2022-07-02 IMAGING — CR DG CHEST 2V
2 series · 2 of 2 positions shown · non-contrast
Comparison: 09/06/2017

CLINICAL DATA: Dyspnea

EXAM:
CHEST - 2 VIEW

[w chest pa]
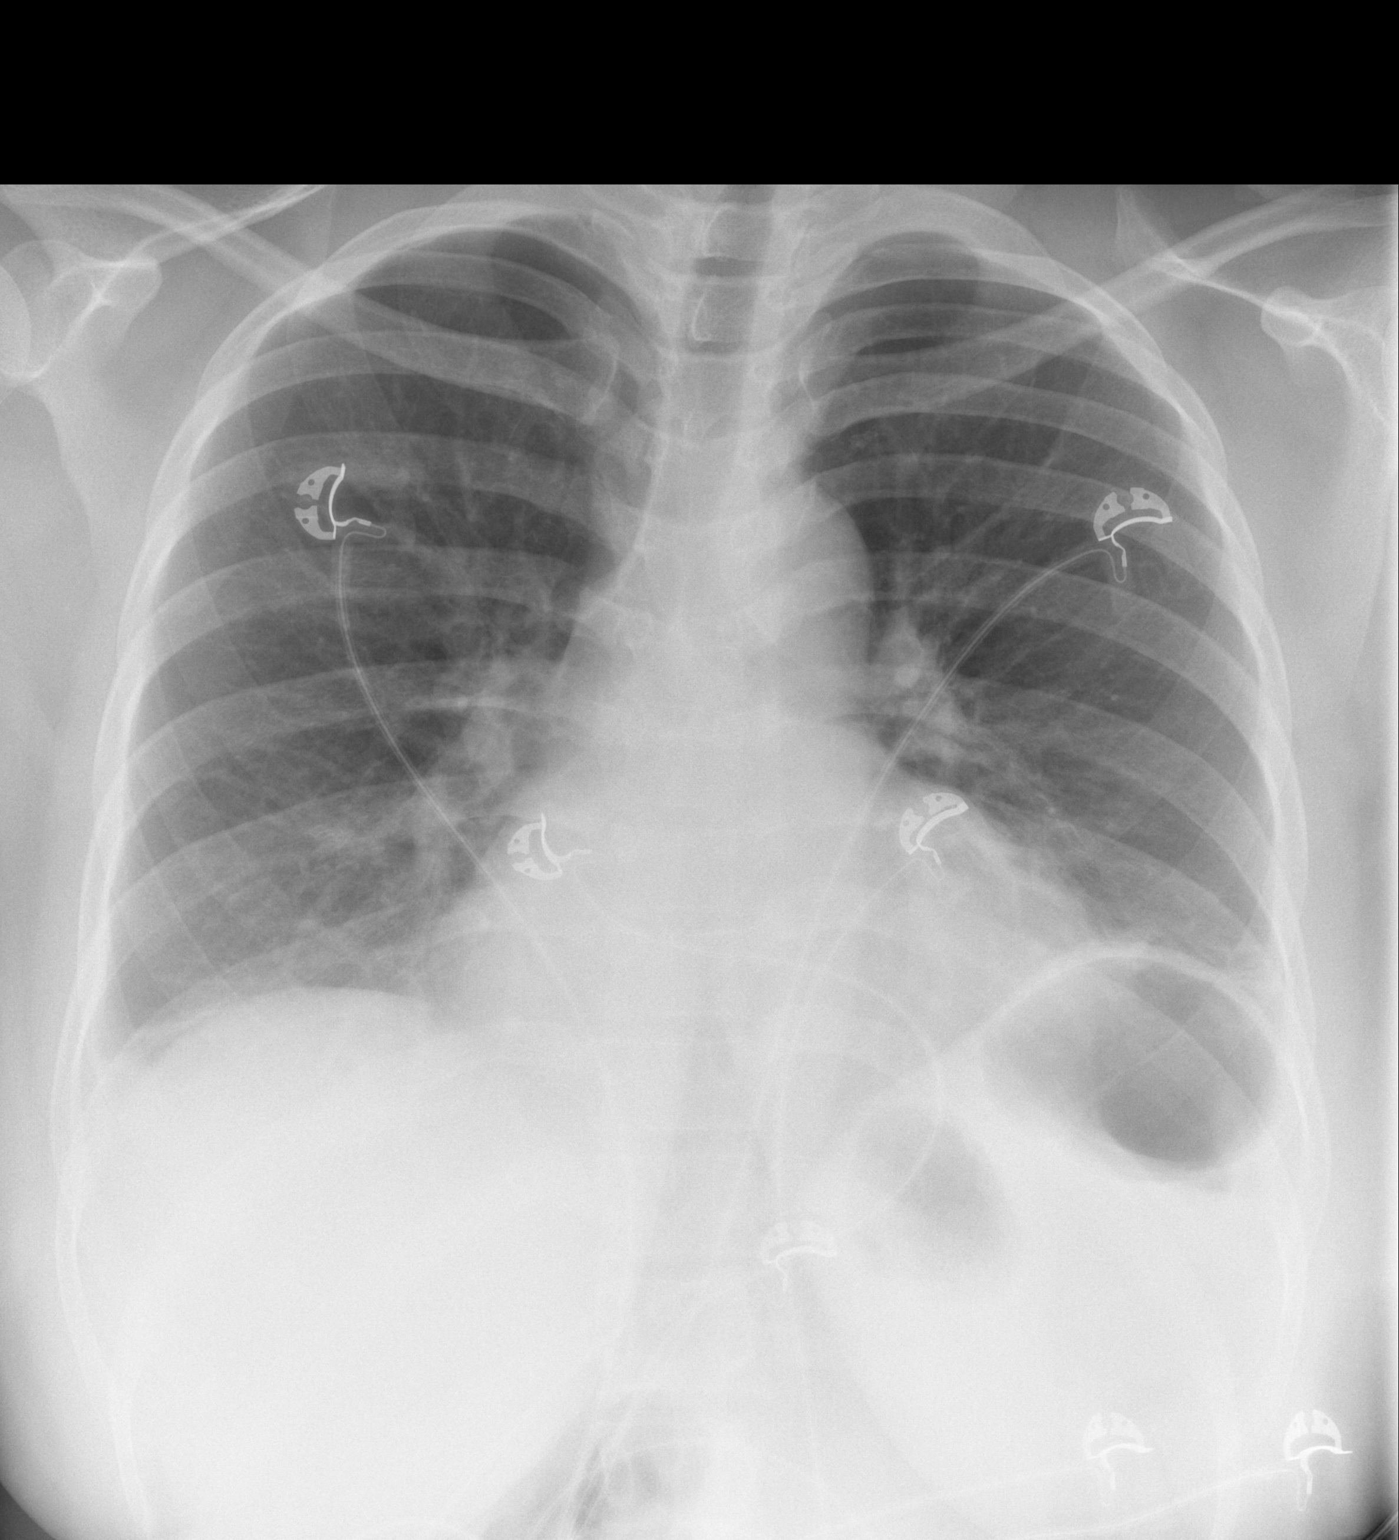

[w chest lat]
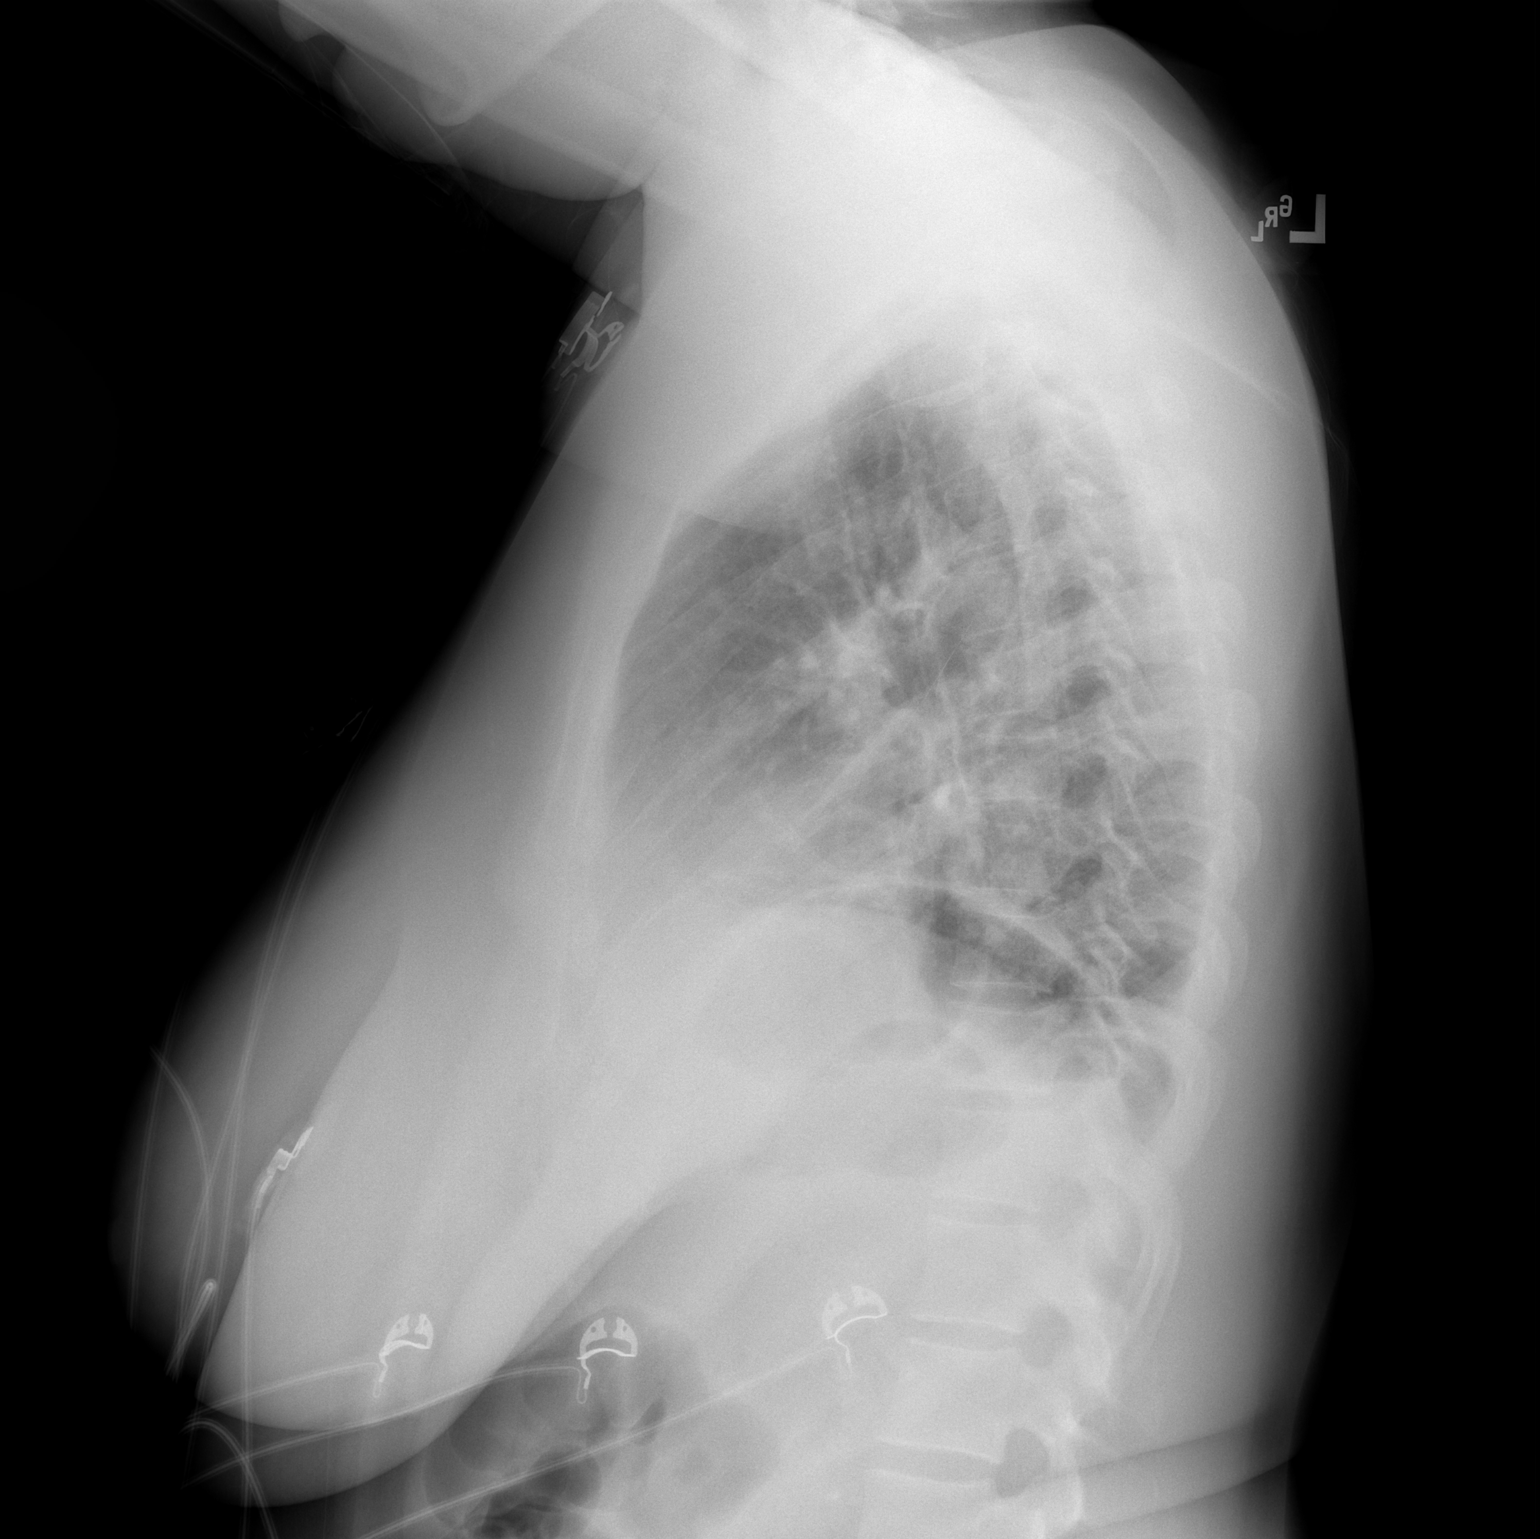

[2 of 2 positions shown; findings below may reference images not displayed]

FINDINGS: The lungs are symmetrically expanded. Mild bibasilar atelectasis. No
superimposed confluent pulmonary infiltrate. No pneumothorax or
pleural effusion. Cardiac size within normal limits. Pulmonary
vascularity is normal. No acute bone abnormality.
IMPRESSION: No active cardiopulmonary disease.

## 2022-07-07 ENCOUNTER — Ambulatory Visit (HOSPITAL_COMMUNITY): Payer: No Payment, Other | Admitting: Mental Health

## 2022-07-17 ENCOUNTER — Other Ambulatory Visit (HOSPITAL_COMMUNITY): Payer: Self-pay | Admitting: Physician Assistant

## 2022-07-17 ENCOUNTER — Telehealth (HOSPITAL_COMMUNITY): Payer: Self-pay | Admitting: Student in an Organized Health Care Education/Training Program

## 2022-07-17 ENCOUNTER — Other Ambulatory Visit (HOSPITAL_COMMUNITY): Payer: Self-pay | Admitting: Psychiatry

## 2022-07-17 DIAGNOSIS — F4001 Agoraphobia with panic disorder: Secondary | ICD-10-CM

## 2022-07-17 MED ORDER — ALPRAZOLAM 1 MG PO TABS: 1.0000 mg | ORAL_TABLET | Freq: Three times a day (TID) | ORAL | 0 refills | Status: DC | PRN

## 2022-07-29 ENCOUNTER — Ambulatory Visit (HOSPITAL_COMMUNITY): Payer: No Payment, Other | Admitting: Mental Health

## 2022-08-05 ENCOUNTER — Encounter (HOSPITAL_COMMUNITY): Payer: No Payment, Other | Admitting: Student in an Organized Health Care Education/Training Program

## 2022-08-14 ENCOUNTER — Ambulatory Visit (INDEPENDENT_AMBULATORY_CARE_PROVIDER_SITE_OTHER): Payer: No Payment, Other | Admitting: Student in an Organized Health Care Education/Training Program

## 2022-08-14 ENCOUNTER — Encounter (HOSPITAL_COMMUNITY): Payer: Self-pay | Admitting: Student in an Organized Health Care Education/Training Program

## 2022-08-14 VITALS — BP 134/91 | HR 85 | Ht 63.5 in | Wt 186.0 lb

## 2022-08-14 DIAGNOSIS — F315 Bipolar disorder, current episode depressed, severe, with psychotic features: Secondary | ICD-10-CM

## 2022-08-14 DIAGNOSIS — G4700 Insomnia, unspecified: Secondary | ICD-10-CM | POA: Diagnosis not present

## 2022-08-14 DIAGNOSIS — F4310 Post-traumatic stress disorder, unspecified: Secondary | ICD-10-CM | POA: Diagnosis not present

## 2022-08-14 DIAGNOSIS — F4001 Agoraphobia with panic disorder: Secondary | ICD-10-CM | POA: Diagnosis not present

## 2022-08-14 MED ORDER — TRAZODONE HCL 100 MG PO TABS: 100.0000 mg | ORAL_TABLET | Freq: Every day | ORAL | 1 refills | Status: DC

## 2022-08-14 MED ORDER — QUETIAPINE FUMARATE 100 MG PO TABS: 100.0000 mg | ORAL_TABLET | Freq: Every day | ORAL | 1 refills | Status: DC

## 2022-08-14 MED ORDER — QUETIAPINE FUMARATE 400 MG PO TABS: 400.0000 mg | ORAL_TABLET | Freq: Every day | ORAL | 1 refills | Status: DC

## 2022-08-14 MED ORDER — SERTRALINE HCL 25 MG PO TABS: 25.0000 mg | ORAL_TABLET | Freq: Every day | ORAL | 1 refills | Status: DC

## 2022-08-14 NOTE — Progress Notes (Signed)
[facility] MD/PA/NP OP Progress Note  08/14/2022 10:43 AM Amber Fitzpatrick  MRN:  [id number]  Chief Complaint:  Chief Complaint  Patient presents with   Follow-up   Anxiety   Depression   Post-Traumatic Stress Disorder   HPI:  Amber Fitzpatrick is a 32 yr old female who presents for Follow Up and Medication Management.  PPHx is significant for Bipolar Disorder, Panic Disorder w/ Agoraphobia, PTSD, and Insomnia, and no history of Suicide Attempts, Self Injurious Behavior, or Psychiatric Hospitalizations.  She reports that the changes in her Seroquel made at the last appointment were okay.  She reports she still has some issues with sleep at night and daytime sedation.  She reports that her anxiety continues to be high.  She reports that her anxiety did increase when she was denied her disability claim.  She reports that she is appealing this and her anxiety is decreasing some.  She reports that she is not using her Xanax 3 times a day recently.  Discussed with her that we would plan to change from Xanax to Klonopin to taper at her next appointment.  Discussed starting Zoloft to treat her depression, anxiety, and PTSD.  Discussed potential risks and side effects and she was agreeable to this.  She reports no SI, HI, or VH.  She does report having AH at times consisting of both positive and negative remarks about her.  She reports her sleep is fair.  She reports her appetite is good.  She reports chronic SOB due to asthma and she reports no concerns present.  She will return for follow-up in approximately 6 weeks.  Visit Diagnosis:    ICD-10-CM   1. Bipolar disorder, current episode depressed, severe, with psychotic features ([place])  F31.5 QUEtiapine (SEROQUEL) 100 MG tablet    QUEtiapine (SEROQUEL) 400 MG tablet    sertraline (ZOLOFT) 25 MG tablet    2. PTSD (post-traumatic stress disorder)  F43.10 QUEtiapine (SEROQUEL) 100 MG tablet    sertraline (ZOLOFT) 25 MG tablet    3. Insomnia, unspecified type   G47.00 traZODone (DESYREL) 100 MG tablet    4. Panic disorder with agoraphobia  F40.01 sertraline (ZOLOFT) 25 MG tablet      Past Psychiatric History: Bipolar Disorder, Panic Disorder w/ Agoraphobia, PTSD, and Insomnia, and no history of Suicide Attempts, Self Injurious Behavior, or Psychiatric Hospitalizations.  Past Medical History:  Past Medical History:  Diagnosis Date   Abnormal Pap smear    f/u ok   Anemia    Anxiety    Asthma    Infection    Mental disorder    Trichimoniasis     Past Surgical History:  Procedure Laterality Date   INDUCED ABORTION     NO PAST SURGERIES      Family Psychiatric History: Maternal Great Uncle- Schizophrenia Maternal Great Aunt- Schizophrenia   Family History:  Family History  Problem Relation Age of Onset   Hypertension Mother    Hyperlipidemia Mother    Hearing loss Father    Anesthesia problems Neg Hx    Other Neg Hx     Social History:  Social History   Socioeconomic History   Marital status: Single    Spouse name: Not on file   Number of children: Not on file   Years of education: Not on file   Highest education level: Not on file  Occupational History   Not on file  Tobacco Use   Smoking status: Every Day    Packs/day: 0.50  Types: Cigarettes   Smokeless tobacco: Never  Substance and Sexual Activity   Alcohol use: No   Drug use: Yes    Types: Marijuana    Comment: occ   Sexual activity: Yes    Birth control/protection: None  Other Topics Concern   Not on file  Social History Narrative   Not on file   Social Determinants of Health   Financial Resource Strain: Low Risk  (06/03/2022)   Overall Financial Resource Strain (CARDIA)    Difficulty of Paying Living Expenses: Not hard at all  Food Insecurity: No Food Insecurity (06/03/2022)   Hunger Vital Sign    Worried About Running Out of Food in the Last Year: Never true    Ran Out of Food in the Last Year: Never true  Transportation Needs: No  Transportation Needs (06/03/2022)   PRAPARE - Hydrologist (Medical): No    Lack of Transportation (Non-Medical): No  Physical Activity: Insufficiently Active (06/03/2022)   Exercise Vital Sign    Days of Exercise per Week: 1 day    Minutes of Exercise per Session: 20 min  Stress: Stress Concern Present (06/03/2022)   [place]    Feeling of Stress : Very much  Social Connections: Moderately Isolated (06/03/2022)   Social Connection and Isolation Panel [NHANES]    Frequency of Communication with Friends and Family: More than three times a week    Frequency of Social Gatherings with Friends and Family: Once a week    Attends Religious Services: 1 to 4 times per year    Active Member of [employer] or Organizations: No    Attends Archivist Meetings: Never    Marital Status: Never married    Allergies:  Allergies  Allergen Reactions   Sulfa Antibiotics Rash    Metabolic Disorder Labs: No results found for: "HGBA1C", "MPG" No results found for: "PROLACTIN" No results found for: "CHOL", "TRIG", "HDL", "CHOLHDL", "VLDL", "LDLCALC" No results found for: "TSH"  Therapeutic Level Labs: No results found for: "LITHIUM" No results found for: "VALPROATE" No results found for: "CBMZ"  Current Medications: Current Outpatient Medications  Medication Sig Dispense Refill   sertraline (ZOLOFT) 25 MG tablet Take 1 tablet (25 mg total) by mouth daily. 30 tablet 1   albuterol (PROVENTIL HFA;VENTOLIN HFA) 108 (90 Base) MCG/ACT inhaler Inhale 1-2 puffs into the lungs every 6 (six) hours as needed for wheezing or shortness of breath. 1 Inhaler 0   ALPRAZolam (XANAX) 1 MG tablet Take 1 tablet (1 mg total) by mouth 3 (three) times daily as needed for anxiety. 90 tablet 0   HYDROcodone-acetaminophen (NORCO/VICODIN) 5-325 MG tablet Take 1 tablet by mouth every 6 (six) hours as needed. 6 tablet 0    ibuprofen (ADVIL) 400 MG tablet Take 1 tablet (400 mg total) by mouth every 8 (eight) hours as needed for moderate pain. 21 tablet 0   QUEtiapine (SEROQUEL) 100 MG tablet Take 1 tablet (100 mg total) by mouth daily. 30 tablet 1   QUEtiapine (SEROQUEL) 400 MG tablet Take 1 tablet (400 mg total) by mouth at bedtime. 30 tablet 1   traZODone (DESYREL) 100 MG tablet Take 1 tablet (100 mg total) by mouth daily. 30 tablet 1   No current facility-administered medications for this visit.     Musculoskeletal: Strength & Muscle Tone: within normal limits Gait & Station: normal Patient leans: N/A  Psychiatric Specialty Exam: Review of Systems  Respiratory:  Positive for shortness of breath (asthma).   Cardiovascular:  Negative for chest pain.  Gastrointestinal:  Negative for abdominal pain, constipation, diarrhea, nausea and vomiting.  Neurological:  Negative for dizziness, weakness and headaches.  Psychiatric/Behavioral:  Positive for dysphoric mood, hallucinations and sleep disturbance. Negative for suicidal ideas. The patient is nervous/anxious.     Blood pressure (!) 134/91, pulse 85, height 5' 3.5" (1.613 m), weight 186 lb (84.4 kg), SpO2 98 %, unknown if currently breastfeeding.Body mass index is 32.43 kg/m.  General Appearance: Casual and Fairly Groomed  Eye Contact:  Good  Speech:  Clear and Coherent and rapid rate with rare slurred word  Volume:  Normal  Mood:  Dysphoric  Affect:  Congruent  Thought Process:  Coherent and Goal Directed  Orientation:  Full (Time, Place, and Person)  Thought Content: WDL, Logical, and reports occasional AH    Suicidal Thoughts:  No  Homicidal Thoughts:  No  Memory:  Immediate;   Good Recent;   Good  Judgement:  Fair  Insight:  Fair  Psychomotor Activity:  Normal  Concentration:  Concentration: Fair and Attention Span: Fair  Recall:  [employer] of Knowledge: Good  Language: Fair  Akathisia:  Negative  Handed:  Right  AIMS (if indicated): not  done  Assets:  Communication Skills Desire for Improvement Housing Resilience Social Support  ADL's:  Intact  Cognition: WNL  Sleep:  Fair   Screenings: GAD-7    Advertising copywriter from 06/03/2022 in [facility] Office Visit from 02/24/2022 in [facility] Office Visit from 12/23/2021 in [facility] Office Visit from 11/12/2021 in [facility]  Total GAD-7 Score 20 20 18 21       PHQ2-9    Flowsheet Row Counselor from 06/03/2022 in [facility] Office Visit from 02/24/2022 in [facility] Office Visit from 12/23/2021 in [facility] Office Visit from 11/12/2021 in [facility]  PHQ-2 Total Score 6 3 5 6   PHQ-9 Total Score 18 18 22 23       Flowsheet Row Counselor from 06/03/2022 in [facility] Office Visit from 02/24/2022 in [facility] Office Visit from 12/23/2021 in [facility]  C-SSRS RISK CATEGORY No Risk No Risk No Risk        Assessment and Plan:  Amber Fitzpatrick is a 32 yr old female who presents for Follow Up and Medication Management.  PPHx is significant for Bipolar Disorder, Panic Disorder w/ Agoraphobia, PTSD, and Insomnia, and no history of Suicide Attempts, Self Injurious Behavior, or Psychiatric Hospitalizations.   Darian continues to endorse significant anxiety and issues with sleep.  As she is not on an antidepressant we will start Zoloft at this time.  She reports she has been decreasing the frequency of the use of her Xanax will not send in a refill at this time but will plan to transition to Klonopin at next appointment to begin tapering.  She will return for follow-up in approximately 6 weeks.   Panic Disorder w/ Agoraphobia: -Continue Xanax 1 mg TID PRN.   No refills sent at this time. -Will transition to Klonopin at next appointment.   Bipolar disorder, current episode depressed, severe, w/ psychotic features  PTSD: -Continue Seroquel 100 mg daily for mood stability and psychosis.  30 tablets with 1 refill. -Continue Seroquel 400 mg QHS mood  stability and psychosis.  30 tablets with 1 refill. -Start Zoloft 25 mg daily for depression and anxiety.  30 tablets with 1 refill.   Insomnia, unspecified type: -Continue Trazodone 100 mg QHS.  30 tablets with 1 refill.     Collaboration of Care: Collaboration of Care: Other provider involved in patient's care AEB [facility] Therapist  Patient/Guardian was advised Release of Information must be obtained prior to any record release in order to collaborate their care with an outside provider. Patient/Guardian was advised if they have not already done so to contact the registration department to sign all necessary forms in order for [place] to release information regarding their care.   Consent: Patient/Guardian gives verbal consent for treatment and assignment of benefits for services provided during this visit. Patient/Guardian expressed understanding and agreed to proceed.    Lauro Franklin, MD 08/14/2022, 10:43 AM

## 2022-09-01 ENCOUNTER — Other Ambulatory Visit (HOSPITAL_COMMUNITY): Payer: Self-pay | Admitting: Physician Assistant

## 2022-09-01 ENCOUNTER — Telehealth (HOSPITAL_COMMUNITY): Payer: Self-pay | Admitting: *Deleted

## 2022-09-01 DIAGNOSIS — F4001 Agoraphobia with panic disorder: Secondary | ICD-10-CM

## 2022-09-01 NOTE — Telephone Encounter (Signed)
Refill of patients Xanax sent in earlier today.   Fatima Sanger MD Resident

## 2022-09-01 NOTE — Telephone Encounter (Signed)
Received message that patient needed refill of her Xanax.  Per PDMP last refill was 12/29 so a 30 day supply so this refill was sent.   Sent: -Xanax 1 mg TID PRN.  90 tablets with 0 refills.   Fatima Sanger MD Resident

## 2022-09-01 NOTE — Telephone Encounter (Signed)
Patient called left a voicemail for refill of Xanax. Next appointment scheduled 10/06/22. Message sent to provider for review.

## 2022-09-04 ENCOUNTER — Ambulatory Visit (INDEPENDENT_AMBULATORY_CARE_PROVIDER_SITE_OTHER): Payer: Medicaid Other | Admitting: Mental Health

## 2022-09-04 DIAGNOSIS — F315 Bipolar disorder, current episode depressed, severe, with psychotic features: Secondary | ICD-10-CM

## 2022-09-04 DIAGNOSIS — F4310 Post-traumatic stress disorder, unspecified: Secondary | ICD-10-CM

## 2022-09-04 NOTE — Progress Notes (Signed)
   THERAPIST PROGRESS NOTE  Session Time: 10:04am ( 30 minutes)   Participation Level: Active  Behavioral Response: CasualAlertWNL  Type of Therapy: Individual Therapy  Treatment Goals addressed: STG: Amber Fitzpatrick will increase management of anxiety/stress AEB development of x 3 effective coping skills with ability to reframe maladaptive thinking patterns daily within the next 6 months    ProgressTowards Goals: Progressing  Interventions: Supportive  Summary: Amber Fitzpatrick is a 32 y.o. female who presents with dx of PTSD and bipolar disorder, current episode depressed. Presents alert and oriented; mood and affect neutral; blunted. Speech clear and coherent. Thought process difficult to follow at times; vague with therapist attempt to gain clairty. Odd presentation. Shares with therapist need to end session prematurely in order to obtain daughter from school due to half day. Shares to have been feeling annoyed with others and notes mother. Speaks of others trying to take advantage of her and and engaging with her based of her past. Shares " I feel targeted. Something good happens then something bad." Then notes, " I am ok. I am ok." Speaks of others in the past seeing her emotional distress and not wanting them to take advantage of her but difficulty elaborating. Shares for seep and appetite to be good and medication compliant. Shares attempting to manage stressors with going somewhere with daughter, activities with ehr daughter and watching t.v. Shares working to communicate better with others. Able to identify coping skills for stress and anxiety with reported stability in sxs.   Suicidal/Homicidal: Nowithout intent/plan  Therapist Response: Therapist engaged [name] in therapy session. Completed check in. Assessed for current level of functioning, sxs management and stressors. Engaged [name] in exploring current stressors and ability to manage. Open ended questions to gain understanding of  stressors and factors that contribute to mood concerns. Active empathic listening, clarifying questions. Assessed for use of coping skills and abiity to communicate with others vs. Isolative behaviors. Assessed for safety concerns. Reviewed session and provided follow up    Plan: Return again in  x 4 weeks.  Diagnosis: Bipolar disorder, current episode depressed, severe, with psychotic features ([place])  PTSD (post-traumatic stress disorder)  Collaboration of Care: Other None  Patient/Guardian was advised Release of Information must be obtained prior to any record release in order to collaborate their care with an outside provider. Patient/Guardian was advised if they have not already done so to contact the registration department to sign all necessary forms in order for [place] to release information regarding their care.   Consent: Patient/Guardian gives verbal consent for treatment and assignment of benefits for services provided during this visit. Patient/Guardian expressed understanding and agreed to proceed.   Rockey Situ [place], [facility] 09/04/2022

## 2022-10-06 ENCOUNTER — Ambulatory Visit (HOSPITAL_COMMUNITY): Payer: Medicaid Other | Admitting: Student in an Organized Health Care Education/Training Program

## 2022-10-07 ENCOUNTER — Ambulatory Visit (INDEPENDENT_AMBULATORY_CARE_PROVIDER_SITE_OTHER): Payer: Medicaid Other | Admitting: Mental Health

## 2022-10-07 DIAGNOSIS — F4310 Post-traumatic stress disorder, unspecified: Secondary | ICD-10-CM

## 2022-10-07 DIAGNOSIS — F315 Bipolar disorder, current episode depressed, severe, with psychotic features: Secondary | ICD-10-CM

## 2022-10-07 NOTE — Progress Notes (Signed)
   THERAPIST PROGRESS NOTE  Session Time: 9:00am ( 40 minutes)  Participation Level: Active  Behavioral Response: CasualAlertAdequate  Type of Therapy: Individual Therapy  Treatment Goals addressed:  STG: Zykiria will increase management of anxiety/stress AEB development of x 3 effective coping skills with ability to reframe maladaptive thinking patterns daily within the next 6 months      ProgressTowards Goals: Progressing  Interventions: CBT and Supportive  Summary: Amber Fitzpatrick is a 32 y.o. female who presents with dx of PTSD and bipolar disorder, current episode depressed. Presents alert and oriented; mood and affect adequate; stable. Speech clear and coherent. Thought process difficult to follow at times; vague at times. Denies extreme highs or lows and reports for moods to be stable. Medication compliant. Chief complaint of ongoing anxiety. Shares difficulty being in tight spaces and shares to feel as if she needs room when she is around others. Notes can began to hyperventilate, feel dizzy and as if she is going to fall out. Notes to dislike the feeling of being stuck and will have thoughts of being stuck. Shares will have thoughts of past trauma and if someone is trying to harm her, manipulate her or have ill intentions. Shares will often leave areas in times of increased anxiety and shares has left places she has wanted to be as a result. Able to process with therapist ability to work through feelings of anxiety with avoidance a maladaptive coping skills. Explored with therapist use of breathing exercises and focusing on her breathing with coping thoughts. Processed with therapist ability to focus on present and acknowledge trauma reactions vs. Real concern for danger. Shares daughter supports I coping know daughter is safe can support in knowing she is safe as well. Denies safety concerns. Working to make progress with goals identifying need for use of coping skills. X 1 coping  skill identified; working to identify maladaptive thinking to reframe as needed. improvement with sxs.   Suicidal/Homicidal: Nowithout intent/plan  Therapist Response:  Therapist engaged Amber Fitzpatrick in therapy session. Completed check in. Assessed for current level of functioning, sxs management and stressors. Engaged Amber Fitzpatrick in exploring current stressors and ability to manage. Provided support and encouragement; validated feelings. Provided safe space to share hx of anxiety and current stressors. Supported in working to explore use of coping skills at increased rate and engage in proactive coping skills vs. Avoidance. Psycho-education on anxiety and self-reinforcing nature. Educated on use of skills with breathe work, cognitive coping and working to remain present focused to support in decreasing severity of anxiety. Processed quality of life and ability to engage in community as she wishes without anxiety interfering. Explored social relationships and balance in daily life. Encouraged self-care; reviewed session and provided follow up appointment. No safety concerns reported.    Plan: Return again in  x 4 weeks.  Diagnosis: Bipolar disorder, current episode depressed, severe, with psychotic features ([place])  PTSD (post-traumatic stress disorder)  Collaboration of Care: Other None  Patient/Guardian was advised Release of Information must be obtained prior to any record release in order to collaborate their care with an outside provider. Patient/Guardian was advised if they have not already done so to contact the registration department to sign all necessary forms in order for [place] to release information regarding their care.   Consent: Patient/Guardian gives verbal consent for treatment and assignment of benefits for services provided during this visit. Patient/Guardian expressed understanding and agreed to proceed.   Rockey Situ [place], [facility] 10/07/2022

## 2022-10-09 ENCOUNTER — Ambulatory Visit (INDEPENDENT_AMBULATORY_CARE_PROVIDER_SITE_OTHER): Payer: Medicaid Other | Admitting: Student in an Organized Health Care Education/Training Program

## 2022-10-09 ENCOUNTER — Encounter (HOSPITAL_COMMUNITY): Payer: Self-pay | Admitting: Student in an Organized Health Care Education/Training Program

## 2022-10-09 DIAGNOSIS — F4310 Post-traumatic stress disorder, unspecified: Secondary | ICD-10-CM

## 2022-10-09 DIAGNOSIS — F315 Bipolar disorder, current episode depressed, severe, with psychotic features: Secondary | ICD-10-CM | POA: Diagnosis not present

## 2022-10-09 DIAGNOSIS — G4700 Insomnia, unspecified: Secondary | ICD-10-CM | POA: Diagnosis not present

## 2022-10-09 DIAGNOSIS — F4001 Agoraphobia with panic disorder: Secondary | ICD-10-CM

## 2022-10-09 MED ORDER — QUETIAPINE FUMARATE 100 MG PO TABS: 100.0000 mg | ORAL_TABLET | Freq: Every day | ORAL | 1 refills | Status: DC

## 2022-10-09 MED ORDER — ALPRAZOLAM 1 MG PO TABS: 1.0000 mg | ORAL_TABLET | Freq: Three times a day (TID) | ORAL | 0 refills | Status: DC | PRN

## 2022-10-09 MED ORDER — SERTRALINE HCL 25 MG PO TABS: 25.0000 mg | ORAL_TABLET | Freq: Every day | ORAL | 1 refills | Status: DC

## 2022-10-09 MED ORDER — TRAZODONE HCL 100 MG PO TABS: 100.0000 mg | ORAL_TABLET | Freq: Every day | ORAL | 1 refills | Status: DC

## 2022-10-09 MED ORDER — QUETIAPINE FUMARATE 400 MG PO TABS: 400.0000 mg | ORAL_TABLET | Freq: Every day | ORAL | 1 refills | Status: DC

## 2022-10-09 NOTE — Progress Notes (Cosign Needed Addendum)
BH MD/PA/NP OP Progress Note  10/09/2022 10:08 AM Amber Fitzpatrick  MRN:  [id number]  Chief Complaint:  Chief Complaint  Patient presents with   Follow-up   Anxiety   Depression   Post-Traumatic Stress Disorder   HPI:  Amber Fitzpatrick is a 32 yr old female who presents for Follow Up and Medication Management.  PPHx is significant for Bipolar Disorder, Panic Disorder w/ Agoraphobia, PTSD, and Insomnia, and no history of Suicide Attempts, Self Injurious Behavior, or Psychiatric Hospitalizations.    She reports that she has been essentially the same since her last appointment.  She reports that she has not yet started Zoloft because she was not sure when to take it and did not know if it would make her sedated or not.  Discussed with her that for most people Zoloft does not make him tired and some people take it in the morning and some take it in the evening and encouraged her to take it whenever it would be most convenient for her.  Discussed with her that since she had not yet started the Zoloft we would not make any other change in her medications at this time but that at the next appointment we would discuss changes.  She reported understanding and was agreeable to this.  She reports no SI, HI, AVH.  She reports sleep is good.  She reports her appetite is doing good.  She reports no concerns at present.  She will return for follow-up in approximately 6 weeks.   Visit Diagnosis:    ICD-10-CM   1. Panic disorder with agoraphobia  F40.01 ALPRAZolam (XANAX) 1 MG tablet    sertraline (ZOLOFT) 25 MG tablet    2. PTSD (post-traumatic stress disorder)  F43.10 QUEtiapine (SEROQUEL) 100 MG tablet    sertraline (ZOLOFT) 25 MG tablet    3. Bipolar disorder, current episode depressed, severe, with psychotic features ([place])  F31.5 QUEtiapine (SEROQUEL) 100 MG tablet    QUEtiapine (SEROQUEL) 400 MG tablet    sertraline (ZOLOFT) 25 MG tablet    4. Insomnia, unspecified type  G47.00 traZODone  (DESYREL) 100 MG tablet      Past Psychiatric History: Bipolar Disorder, Panic Disorder w/ Agoraphobia, PTSD, and Insomnia, and no history of Suicide Attempts, Self Injurious Behavior, or Psychiatric Hospitalizations.   Past Medical History:  Past Medical History:  Diagnosis Date   Abnormal Pap smear    f/u ok   Anemia    Anxiety    Asthma    Infection    Mental disorder    Trichimoniasis     Past Surgical History:  Procedure Laterality Date   INDUCED ABORTION     NO PAST SURGERIES      Family Psychiatric History: Maternal Great Uncle- Schizophrenia Maternal Great Aunt- Schizophrenia   Family History:  Family History  Problem Relation Age of Onset   Hypertension Mother    Hyperlipidemia Mother    Hearing loss Father    Anesthesia problems Neg Hx    Other Neg Hx     Social History:  Social History   Socioeconomic History   Marital status: Single    Spouse name: Not on file   Number of children: Not on file   Years of education: Not on file   Highest education level: Not on file  Occupational History   Not on file  Tobacco Use   Smoking status: Every Day    Packs/day: .5    Types: Cigarettes   Smokeless tobacco: Never  Substance and Sexual Activity   Alcohol use: No   Drug use: Yes    Types: Marijuana    Comment: occ   Sexual activity: Yes    Birth control/protection: None  Other Topics Concern   Not on file  Social History Narrative   Not on file   Social Determinants of Health   Financial Resource Strain: Low Risk  (06/03/2022)   Overall Financial Resource Strain (CARDIA)    Difficulty of Paying Living Expenses: Not hard at all  Food Insecurity: No Food Insecurity (06/03/2022)   Hunger Vital Sign    Worried About Running Out of Food in the Last Year: Never true    Ran Out of Food in the Last Year: Never true  Transportation Needs: No Transportation Needs (06/03/2022)   PRAPARE - Hydrologist (Medical): No    Lack  of Transportation (Non-Medical): No  Physical Activity: Insufficiently Active (06/03/2022)   Exercise Vital Sign    Days of Exercise per Week: 1 day    Minutes of Exercise per Session: 20 min  Stress: Stress Concern Present (06/03/2022)   [place]    Feeling of Stress : Very much  Social Connections: Moderately Isolated (06/03/2022)   Social Connection and Isolation Panel [NHANES]    Frequency of Communication with Friends and Family: More than three times a week    Frequency of Social Gatherings with Friends and Family: Once a week    Attends Religious Services: 1 to 4 times per year    Active Member of [employer] or Organizations: No    Attends Archivist Meetings: Never    Marital Status: Never married    Allergies:  Allergies  Allergen Reactions   Sulfa Antibiotics Rash    Metabolic Disorder Labs: No results found for: "HGBA1C", "MPG" No results found for: "PROLACTIN" No results found for: "CHOL", "TRIG", "HDL", "CHOLHDL", "VLDL", "LDLCALC" No results found for: "TSH"  Therapeutic Level Labs: No results found for: "LITHIUM" No results found for: "VALPROATE" No results found for: "CBMZ"  Current Medications: Current Outpatient Medications  Medication Sig Dispense Refill   albuterol (PROVENTIL HFA;VENTOLIN HFA) 108 (90 Base) MCG/ACT inhaler Inhale 1-2 puffs into the lungs every 6 (six) hours as needed for wheezing or shortness of breath. 1 Inhaler 0   ALPRAZolam (XANAX) 1 MG tablet Take 1 tablet (1 mg total) by mouth 3 (three) times daily as needed. 90 tablet 0   HYDROcodone-acetaminophen (NORCO/VICODIN) 5-325 MG tablet Take 1 tablet by mouth every 6 (six) hours as needed. 6 tablet 0   ibuprofen (ADVIL) 400 MG tablet Take 1 tablet (400 mg total) by mouth every 8 (eight) hours as needed for moderate pain. 21 tablet 0   QUEtiapine (SEROQUEL) 100 MG tablet Take 1 tablet (100 mg total) by mouth daily. 30  tablet 1   QUEtiapine (SEROQUEL) 400 MG tablet Take 1 tablet (400 mg total) by mouth at bedtime. 30 tablet 1   sertraline (ZOLOFT) 25 MG tablet Take 1 tablet (25 mg total) by mouth daily. 30 tablet 1   traZODone (DESYREL) 100 MG tablet Take 1 tablet (100 mg total) by mouth daily. 30 tablet 1   No current facility-administered medications for this visit.     Musculoskeletal: Strength & Muscle Tone: within normal limits Gait & Station: normal Patient leans: N/A  Psychiatric Specialty Exam: Review of Systems  Respiratory:  Negative for shortness of breath.   Cardiovascular:  Negative for chest pain.  Gastrointestinal:  Negative for abdominal pain, constipation, diarrhea, nausea and vomiting.  Neurological:  Negative for dizziness, weakness and headaches.  Psychiatric/Behavioral:  Positive for dysphoric mood. Negative for hallucinations, sleep disturbance and suicidal ideas. The patient is nervous/anxious.     Blood pressure (!) 146/102, pulse (!) 55, height 5' 3.5" (1.613 m), weight 185 lb 6.4 oz (84.1 kg), SpO2 100 %, unknown if currently breastfeeding.Body mass index is 32.33 kg/m.  General Appearance: Casual and Fairly Groomed  Eye Contact:  Good  Speech:  Clear and Coherent and Normal Rate  Volume:  Normal  Mood:  Anxious  Affect:  Congruent  Thought Process:  mostly coherent but occasionally disorganized  Orientation:  Full (Time, Place, and Person)  Thought Content: WDL and Logical   Suicidal Thoughts:  No  Homicidal Thoughts:  No  Memory:  Immediate;   Good Recent;   Good  Judgement:  Good  Insight:  Good  Psychomotor Activity:  Normal  Concentration:  Concentration: Fair and Attention Span: Fair  Recall:  Good  Fund of Knowledge: Good  Language: Good  Akathisia:  Negative  Handed:  Right  AIMS (if indicated): done AIMS = 0  Assets:  Communication Skills Desire for Improvement Housing Resilience Social Support  ADL's:  Intact  Cognition: WNL  Sleep:  Good    Screenings: GAD-7    Health and safety inspector from 06/03/2022 in [facility] Office Visit from 02/24/2022 in [facility] Office Visit from 12/23/2021 in [facility] Office Visit from 11/12/2021 in [facility]  Total GAD-7 Score 20 20 18 21       PHQ2-9    Flowsheet Row Counselor from 06/03/2022 in [facility] Office Visit from 02/24/2022 in [facility] Office Visit from 12/23/2021 in [facility] Office Visit from 11/12/2021 in [place]  PHQ-2 Total Score 6 3 5 6   PHQ-9 Total Score 18 18 22 23       Flowsheet Row Counselor from 06/03/2022 in [facility] Office Visit from 02/24/2022 in [facility] Office Visit from 12/23/2021 in [place] No Risk No Risk No Risk        Assessment and Plan:  Amber Fitzpatrick is a 32 yr old female who presents for Follow Up and Medication Management.  PPHx is significant for Bipolar Disorder, Panic Disorder w/ Agoraphobia, PTSD, and Insomnia, and no history of Suicide Attempts, Self Injurious Behavior, or Psychiatric Hospitalizations.    Amber Fitzpatrick has remained stable since her last appointment but has not started the Zoloft yet.  She will start Zoloft to see how this affects her anxiety.  Due to not yet starting the Zoloft we will not make any changes to her medications at this time but will consider transition to Klonopin at follow-up if Zoloft starch to be successful.  Refills were sent in.  She continues to have issues with elevated blood pressure and she reports she will schedule an appointment sooner with her PCP to continue addressing this.  She will return for follow-up in approximately 6 weeks.   Panic Disorder w/  Agoraphobia: -Continue Xanax 1 mg TID PRN.  90 tablets with 0 refills. -Will consider transition to Klonopin at next appointment.     Bipolar disorder, current episode depressed, severe, w/ psychotic features  PTSD: -Continue  Seroquel 100 mg daily for mood stability and psychosis.  30 tablets with 1 refill. -Continue Seroquel 400 mg QHS mood stability and psychosis.  30 tablets with 1 refill. -Start Zoloft 25 mg daily for depression and anxiety.  30 tablets with 1 refill.     Insomnia, unspecified type: -Continue Trazodone 100 mg QHS.  30 tablets with 1 refill.     Collaboration of Care: Collaboration of Care: Other provider involved in patient's care AEB [facility] Therapist  Patient/Guardian was advised Release of Information must be obtained prior to any record release in order to collaborate their care with an outside provider. Patient/Guardian was advised if they have not already done so to contact the registration department to sign all necessary forms in order for [place] to release information regarding their care.   Consent: Patient/Guardian gives verbal consent for treatment and assignment of benefits for services provided during this visit. Patient/Guardian expressed understanding and agreed to proceed.    Briant Cedar, MD 10/09/2022, 10:08 AM

## 2022-11-11 ENCOUNTER — Ambulatory Visit (INDEPENDENT_AMBULATORY_CARE_PROVIDER_SITE_OTHER): Payer: Medicaid Other | Admitting: Mental Health

## 2022-11-11 DIAGNOSIS — F315 Bipolar disorder, current episode depressed, severe, with psychotic features: Secondary | ICD-10-CM

## 2022-11-11 DIAGNOSIS — F4310 Post-traumatic stress disorder, unspecified: Secondary | ICD-10-CM

## 2022-11-11 NOTE — Progress Notes (Signed)
THERAPIST PROGRESS NOTE  Session Time: 9:05am ( 47 minutes)   Participation Level: Active  Behavioral Response: CasualAlertEuthymic  Type of Therapy: Individual Therapy  Treatment Goals addressed: STG: [name] will increase management of anxiety/stress AEB development of x 3 effective coping skills with ability to reframe maladaptive thinking patterns daily within the next 6 months      ProgressTowards Goals: Progressing  Interventions: CBT and Supportive  Summary: Amber Fitzpatrick is a 32 y.o. female who presents with dx of PTSD and bipolar disorder, current episode depressed. Presents alert and oriented; mood and affect adequate; stable. Speech clear and coherent. Thought process clear and coherent; vague at times. Denies extreme highs or lows and reports for moods to be stable. Notes feelings of being "tired." Engaged with therapist and shares concerns for at times awareness of PTSD triggers to present in which may effect her responses. Shares event of engagement with daughter and upcoming EOG testing in which she thought daughter was being treated unfairly from previous interactions at previous school and personal past of her not being treated well by others. Notes times in which she has had to apologize to school staff as a result. Able to explore working to cope with delaying impulsive reactions and gaining additional information prior to responding and being so reactive and maladaptive thoughts of jumping to conclusions.  Shares concerning past of interactions with friendships and ways in which she feels they were not supportive. Shares thoughts on boundaries and physical boundaries. Shares increase in mood related to weather and ability to engage in activities in which she enjoys as means of coping with moods and feelings of stress. Notes ongoing work to obtain disability with stressors mainly related to finances. Notes plans to go fishing, attending family gathering and go to beach.  Sxs stabilty reported GAD: of 12 (previously a 20) and PHQ: 4 (previously 18). Progress with goals with ability to identify coping skills and identifying errors in her thinking that contribute to sxs. No safety concerns reported.   Suicidal/Homicidal: Nowithout intent/plan  Therapist Response: Therapist engaged Amber Fitzpatrick in therapy session. Completed check in. Assessed for current level of functioning, sxs management and stressors. Engaged Amber Fitzpatrick in exploring current stressors and ability to manage. Provided support and encouragement; validated feelings. Provided safe space to share ways in which trauma reactions have presented in her life. Engaged in education of working to operative out of the present and being slow to react to allow self to respond appropriately. Encouraged approaching concerns with curiosity in which times she is aware of triggers of wrong doing to self otr child. Supported in processing ability to navigate relationships and setting boundaries with others as needed. Reviewed working to be aware of maladaptive thinking and ability to reframe. Reviewed presence of coping skills. Completed PHQ and GAD and discussed scores. Reviewed session provided follow up.   Plan: Return again in x 4 weeks.  Diagnosis: Bipolar disorder, current episode depressed, severe, with psychotic features  PTSD (post-traumatic stress disorder)  Collaboration of Care: Other None  Patient/Guardian was advised Release of Information must be obtained prior to any record release in order to collaborate their care with an outside provider. Patient/Guardian was advised if they have not already done so to contact the registration department to sign all necessary forms in order for [place] to release information regarding their care.   Consent: Patient/Guardian gives verbal consent for treatment and assignment of benefits for services provided during this visit. Patient/Guardian expressed understanding and agreed to  proceed.   Stephan Minister [place], [facility] 11/11/2022

## 2022-11-19 ENCOUNTER — Telehealth (HOSPITAL_COMMUNITY): Payer: Self-pay | Admitting: Student in an Organized Health Care Education/Training Program

## 2022-11-20 ENCOUNTER — Other Ambulatory Visit (HOSPITAL_COMMUNITY): Payer: Self-pay | Admitting: Student in an Organized Health Care Education/Training Program

## 2022-11-20 ENCOUNTER — Encounter (HOSPITAL_COMMUNITY): Payer: Medicaid Other | Admitting: Student in an Organized Health Care Education/Training Program

## 2022-11-20 DIAGNOSIS — F4001 Agoraphobia with panic disorder: Secondary | ICD-10-CM

## 2022-11-20 MED ORDER — ALPRAZOLAM 1 MG PO TABS: 1.0000 mg | ORAL_TABLET | Freq: Three times a day (TID) | ORAL | 0 refills | Status: DC | PRN

## 2022-11-20 NOTE — Telephone Encounter (Signed)
Forwarding to Dr. Morrie Sheldon per Dr. Lucianne Muss.

## 2022-11-20 NOTE — Telephone Encounter (Signed)
Done, refilled bridge to next appt.

## 2022-11-20 NOTE — Progress Notes (Signed)
Patient requesting refill of Xanax and has called in endorsing anxiety symptoms. Per PDMP, patient is likely out and her F/U appt was pushed out due to provider scheduling. Will provider a bridge to get to next appt.    PGY-3 Eliseo Gum, MD

## 2022-11-20 NOTE — Telephone Encounter (Signed)
Pt walked in this morning asking for refill on her Xanax. Last filled on March 22 for 90 tabs. Pt can take up to 3 times a day. She was scheduled for an appointment this wweek however the appt was moved due to provider being out of office this week. She is out of medication and is complaining of chest tightness due to anxiety. Requesting covering provider to fill Rx at [facility] [address].

## 2022-11-20 NOTE — Telephone Encounter (Signed)
Pt clld again states her anxiety is very bad... she is checking to see if her meds was sent.

## 2022-11-30 ENCOUNTER — Ambulatory Visit (INDEPENDENT_AMBULATORY_CARE_PROVIDER_SITE_OTHER): Payer: Medicaid Other | Admitting: Student in an Organized Health Care Education/Training Program

## 2022-11-30 DIAGNOSIS — F315 Bipolar disorder, current episode depressed, severe, with psychotic features: Secondary | ICD-10-CM

## 2022-11-30 DIAGNOSIS — F4310 Post-traumatic stress disorder, unspecified: Secondary | ICD-10-CM | POA: Diagnosis not present

## 2022-11-30 DIAGNOSIS — G4700 Insomnia, unspecified: Secondary | ICD-10-CM

## 2022-11-30 DIAGNOSIS — F4001 Agoraphobia with panic disorder: Secondary | ICD-10-CM | POA: Diagnosis not present

## 2022-11-30 MED ORDER — ALPRAZOLAM 1 MG PO TABS: 1.0000 mg | ORAL_TABLET | Freq: Three times a day (TID) | ORAL | 0 refills | Status: DC | PRN

## 2022-11-30 MED ORDER — QUETIAPINE FUMARATE 400 MG PO TABS: 400.0000 mg | ORAL_TABLET | Freq: Every day | ORAL | 1 refills | Status: DC

## 2022-11-30 MED ORDER — QUETIAPINE FUMARATE 100 MG PO TABS: 100.0000 mg | ORAL_TABLET | Freq: Every day | ORAL | 1 refills | Status: DC

## 2022-11-30 MED ORDER — TRAZODONE HCL 100 MG PO TABS: 100.0000 mg | ORAL_TABLET | Freq: Every day | ORAL | 1 refills | Status: DC

## 2022-11-30 NOTE — Progress Notes (Signed)
BH MD/PA/NP OP Progress Note  12/01/2022 5:21 AM Amber Fitzpatrick  MRN:  [id number]  Chief Complaint:  Chief Complaint  Patient presents with   Follow-up   Anxiety   Depression   Post-Traumatic Stress Disorder   HPI:  Amber Fitzpatrick is a 32 yr old female who presents for Follow Up and Medication Management. PPHx is significant for Bipolar Disorder, Panic Disorder w/ Agoraphobia, PTSD, and Insomnia, and no history of Suicide Attempts, Self Injurious Behavior, or Psychiatric Hospitalizations.   She reports that she has not had improvement since her last appointment.  She reports that she has had nausea and vomiting since starting the Zoloft.  She does report that this could just be due to worsening anxiety because she has been under significantly more stress recently.  She reports she has been having car issues and so now needs to buy a new car and is having significant issues with this.  Discussed with her that we would stop the Zoloft at this time and she was agreeable with this.  She reports she feels like her anxiety will decrease once she is able to deal with this acute stressor.  She reports no SI, HI, or AVH.  She reports her sleep is normally good but recently has been on worse due to her stress.  She reports her appetite is good.  She reports no other concerns at present.  She will return for follow-up in approximately 6 to 8 weeks.  Discussed with patient that Resident Provider would be transitioning their care to another Resident Provider, Dr. Jerrel Ivory, starting July 2024.    Visit Diagnosis:    ICD-10-CM   1. Bipolar disorder, current episode depressed, severe, with psychotic features (HCC)  F31.5 QUEtiapine (SEROQUEL) 400 MG tablet    QUEtiapine (SEROQUEL) 100 MG tablet    2. PTSD (post-traumatic stress disorder)  F43.10 QUEtiapine (SEROQUEL) 100 MG tablet    3. Insomnia, unspecified type  G47.00 traZODone (DESYREL) 100 MG tablet    4. Panic disorder with agoraphobia   F40.01 ALPRAZolam (XANAX) 1 MG tablet      Past Psychiatric History: Bipolar Disorder, Panic Disorder w/ Agoraphobia, PTSD, and Insomnia, and no history of Suicide Attempts, Self Injurious Behavior, or Psychiatric Hospitalizations.   Past Medical History:  Past Medical History:  Diagnosis Date   Abnormal Pap smear    f/u ok   Anemia    Anxiety    Asthma    Infection    Mental disorder    Trichimoniasis     Past Surgical History:  Procedure Laterality Date   INDUCED ABORTION     NO PAST SURGERIES      Family Psychiatric History: Maternal Great Uncle- Schizophrenia Maternal Great Aunt- Schizophrenia   Family History:  Family History  Problem Relation Age of Onset   Hypertension Mother    Hyperlipidemia Mother    Hearing loss Father    Anesthesia problems Neg Hx    Other Neg Hx     Social History:  Social History   Socioeconomic History   Marital status: Single    Spouse name: Not on file   Number of children: Not on file   Years of education: Not on file   Highest education level: Not on file  Occupational History   Not on file  Tobacco Use   Smoking status: Every Day    Packs/day: .5    Types: Cigarettes   Smokeless tobacco: Never  Substance and Sexual Activity   Alcohol use:  No   Drug use: Yes    Types: Marijuana    Comment: occ   Sexual activity: Yes    Birth control/protection: None  Other Topics Concern   Not on file  Social History Narrative   Not on file   Social Determinants of Health   Financial Resource Strain: Low Risk  (06/03/2022)   Overall Financial Resource Strain (CARDIA)    Difficulty of Paying Living Expenses: Not hard at all  Food Insecurity: No Food Insecurity (06/03/2022)   Hunger Vital Sign    Worried About Running Out of Food in the Last Year: Never true    Ran Out of Food in the Last Year: Never true  Transportation Needs: No Transportation Needs (06/03/2022)   PRAPARE - Administrator, Civil Service  (Medical): No    Lack of Transportation (Non-Medical): No  Physical Activity: Insufficiently Active (06/03/2022)   Exercise Vital Sign    Days of Exercise per Week: 1 day    Minutes of Exercise per Session: 20 min  Stress: Stress Concern Present (06/03/2022)   [employer] of Occupational Health - Occupational Stress Questionnaire    Feeling of Stress : Very much  Social Connections: Moderately Isolated (06/03/2022)   Social Connection and Isolation Panel [NHANES]    Frequency of Communication with Friends and Family: More than three times a week    Frequency of Social Gatherings with Friends and Family: Once a week    Attends Religious Services: 1 to 4 times per year    Active Member of [employer] or Organizations: No    Attends Banker Meetings: Never    Marital Status: Never married    Allergies:  Allergies  Allergen Reactions   Sulfa Antibiotics Rash    Metabolic Disorder Labs: No results found for: "HGBA1C", "MPG" No results found for: "PROLACTIN" No results found for: "CHOL", "TRIG", "HDL", "CHOLHDL", "VLDL", "LDLCALC" No results found for: "TSH"  Therapeutic Level Labs: No results found for: "LITHIUM" No results found for: "VALPROATE" No results found for: "CBMZ"  Current Medications: Current Outpatient Medications  Medication Sig Dispense Refill   albuterol (PROVENTIL HFA;VENTOLIN HFA) 108 (90 Base) MCG/ACT inhaler Inhale 1-2 puffs into the lungs every 6 (six) hours as needed for wheezing or shortness of breath. 1 Inhaler 0   ALPRAZolam (XANAX) 1 MG tablet Take 1 tablet (1 mg total) by mouth 3 (three) times daily as needed. 90 tablet 0   HYDROcodone-acetaminophen (NORCO/VICODIN) 5-325 MG tablet Take 1 tablet by mouth every 6 (six) hours as needed. 6 tablet 0   ibuprofen (ADVIL) 400 MG tablet Take 1 tablet (400 mg total) by mouth every 8 (eight) hours as needed for moderate pain. 21 tablet 0   QUEtiapine (SEROQUEL) 100 MG tablet Take 1 tablet (100 mg  total) by mouth daily. 30 tablet 1   QUEtiapine (SEROQUEL) 400 MG tablet Take 1 tablet (400 mg total) by mouth at bedtime. 30 tablet 1   traZODone (DESYREL) 100 MG tablet Take 1 tablet (100 mg total) by mouth daily. 30 tablet 1   No current facility-administered medications for this visit.     Musculoskeletal: Strength & Muscle Tone: within normal limits Gait & Station: normal Patient leans: N/A  Psychiatric Specialty Exam: Review of Systems  Respiratory:  Negative for shortness of breath.   Cardiovascular:  Negative for chest pain.  Gastrointestinal:  Negative for abdominal pain, constipation, diarrhea, nausea and vomiting.  Neurological:  Negative for dizziness, weakness and headaches.  Psychiatric/Behavioral:  Positive for dysphoric mood and sleep disturbance. Negative for hallucinations and suicidal ideas. The patient is nervous/anxious.     Blood pressure (!) 145/108, pulse 81, height 5' 3.5" (1.613 m), weight 187 lb 6.4 oz (85 kg), SpO2 99 %, unknown if currently breastfeeding.Body mass index is 32.68 kg/m.  General Appearance: Casual and Fairly Groomed  Eye Contact:  Good  Speech:  Clear and Coherent and Normal Rate  Volume:  Normal  Mood:  Anxious  Affect:  Congruent  Thought Process:  Coherent and Goal Directed  Orientation:  Full (Time, Place, and Person)  Thought Content: WDL and Logical   Suicidal Thoughts:  No  Homicidal Thoughts:  No  Memory:  Immediate;   Good Recent;   Good  Judgement:  Fair  Insight:  Fair  Psychomotor Activity:  Restlessness  Concentration:  Concentration: Good and Attention Span: Good  Recall:  Good  Fund of Knowledge: Good  Language: Good  Akathisia:  Negative  Handed:  Right  AIMS (if indicated): not done  Assets:  Communication Skills Desire for Improvement Housing Resilience Social Support  ADL's:  Intact  Cognition: WNL  Sleep:  Poor   Screenings: GAD-7    Advertising copywriter from 11/11/2022 in [facility] Counselor from 06/03/2022 in [facility] Office Visit from 02/24/2022 in [facility] Office Visit from 12/23/2021 in [facility] Office Visit from 11/12/2021 in [facility]  Total GAD-7 Score 12 20 20 18 21       PHQ2-9    Flowsheet Row Counselor from 11/11/2022 in [facility] Counselor from 06/03/2022 in [facility] Office Visit from 02/24/2022 in [facility] Office Visit from 12/23/2021 in [facility] Office Visit from 11/12/2021 in [facility]  PHQ-2 Total Score 0 6 3 5 6   PHQ-9 Total Score 4 18 18 22 23       Flowsheet Row Counselor from 11/11/2022 in [facility] Counselor from 06/03/2022 in [facility] Office Visit from 02/24/2022 in [facility]  C-SSRS RISK CATEGORY No Risk No Risk No Risk        Assessment and Plan:  Amber Fitzpatrick is a 32 yr old female who presents for Follow Up and Medication Management. PPHx is significant for Bipolar Disorder, Panic Disorder w/ Agoraphobia, PTSD, and Insomnia, and no history of Suicide Attempts, Self Injurious Behavior, or Psychiatric Hospitalizations.    Amber Fitzpatrick has had worsening of her anxiety due to life stressors and has had nausea/vomiting since starting the Zoloft.  We will stop the Zoloft at this time.  We will not make any other changes to her medications at this time.  Her blood pressure continues to be elevated which she is continuing to work on with her PCP.  She will return for follow up in approximately 6-8 weeks.   Panic Disorder w/ Agoraphobia: -Continue Xanax 1 mg TID PRN.  90 tablets with 0 refills.     Bipolar disorder, current episode depressed, severe, w/ psychotic  features  PTSD: -Continue Seroquel 100 mg daily for mood stability and psychosis.  30 tablets with 1 refill. -Continue Seroquel 400 mg QHS mood stability and psychosis.  30 tablets with 1 refill. -Stop Zoloft     Insomnia, unspecified type: -Continue Trazodone 100 mg QHS.  30 tablets with 1 refill.  Collaboration of Care: Collaboration of Care: Other provider involved in patient's care [facility] Therapist  Patient/Guardian was advised Release of Information must be obtained prior to any record release in order to collaborate their care with an outside provider. Patient/Guardian was advised if they have not already done so to contact the registration department to sign all necessary forms in order for [place] to release information regarding their care.   Consent: Patient/Guardian gives verbal consent for treatment and assignment of benefits for services provided during this visit. Patient/Guardian expressed understanding and agreed to proceed.    Lauro Franklin, MD 12/01/2022, 5:21 AM

## 2022-12-22 ENCOUNTER — Ambulatory Visit (HOSPITAL_COMMUNITY): Payer: Medicaid Other | Admitting: Mental Health
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
## 2023-01-07 ENCOUNTER — Ambulatory Visit (HOSPITAL_COMMUNITY): Payer: Medicaid Other | Admitting: Student

## 2023-01-07 ENCOUNTER — Ambulatory Visit (HOSPITAL_COMMUNITY): Payer: Medicaid Other | Admitting: Psychiatry

## 2023-01-12 ENCOUNTER — Encounter (HOSPITAL_COMMUNITY): Payer: Self-pay | Admitting: Physician Assistant

## 2023-01-12 ENCOUNTER — Ambulatory Visit (INDEPENDENT_AMBULATORY_CARE_PROVIDER_SITE_OTHER): Payer: Medicaid Other | Admitting: Physician Assistant

## 2023-01-12 DIAGNOSIS — F315 Bipolar disorder, current episode depressed, severe, with psychotic features: Secondary | ICD-10-CM

## 2023-01-12 DIAGNOSIS — G4700 Insomnia, unspecified: Secondary | ICD-10-CM | POA: Diagnosis not present

## 2023-01-12 DIAGNOSIS — F4001 Agoraphobia with panic disorder: Secondary | ICD-10-CM

## 2023-01-12 DIAGNOSIS — F4310 Post-traumatic stress disorder, unspecified: Secondary | ICD-10-CM | POA: Diagnosis not present

## 2023-01-12 MED ORDER — TRAZODONE HCL 100 MG PO TABS
100.0000 mg | ORAL_TABLET | Freq: Every day | ORAL | 2 refills | Status: DC
Start: 2023-01-12 — End: 2023-03-25

## 2023-01-12 MED ORDER — ALPRAZOLAM 1 MG PO TABS
1.0000 mg | ORAL_TABLET | Freq: Three times a day (TID) | ORAL | 0 refills | Status: DC | PRN
Start: 2023-01-12 — End: 2023-02-24

## 2023-01-12 MED ORDER — QUETIAPINE FUMARATE 400 MG PO TABS
400.0000 mg | ORAL_TABLET | Freq: Every day | ORAL | 2 refills | Status: DC
Start: 2023-01-12 — End: 2023-03-25

## 2023-01-12 MED ORDER — QUETIAPINE FUMARATE 100 MG PO TABS: 100.0000 mg | ORAL_TABLET | Freq: Every day | ORAL | 2 refills | Status: DC

## 2023-01-12 NOTE — Progress Notes (Signed)
BH MD/PA/NP OP Progress Note  01/12/2023 9:06 AM Amber Fitzpatrick  MRN:  [id number]  Chief Complaint:  Chief Complaint  Patient presents with   Follow-up   Medication Refill   HPI:   Amber Fitzpatrick is a 32 year old, African-American female with a past psychiatric history significant for bipolar disorder (with psychotic features), PTSD, insomnia, and panic disorder with agoraphobia who presents to [facility] for follow-up and medication management.  Patient was last seen by Mardelle Matte. Renaldo Fiddler, MD 11/30/2022.  Patient is currently being managed on the following psychiatric medications:  Seroquel 100 mg daily Seroquel 400 mg at bedtime Trazodone 100 mg at bedtime Alprazolam (Xanax) 1 mg 3 times daily as needed  Patient presents today encounter stating that she missed her last appointment and is requesting refills on her medications at this time.  Patient reports that she still continues to have dreams and visions of stuff that happens in the future.  She reports that her body experiences stress from her dreams.  Although patient continues to experience these dreams, she reports that her medications are effective in managing her symptoms.  Patient denies depression but does endorse anxiety she rates at 10 out of 10.  Patient denies any new stressors at this time.  Patient endorses stability on her current medication regimen.  A GAD-7 screen was performed with the patient scoring a 19.  Patient is alert and oriented x 4, calm, cooperative, and fully engaged in conversation during the encounter.  Patient endorses neutral mood describing it as "blah."  Patient denies suicidal or homicidal ideations.  Patient denies visual hallucinations but continues to endorse auditory hallucinations.  She reports that her last auditory hallucination occurred roughly a few minutes before the start of this encounter.  Patient's auditory hallucinations are characterized  by voices discussing what she is currently doing at the moment.  Patient endorses good sleep and receives on average 8 hours of sleep per night.  Patient endorses fair appetite and eats on average 1 meal per day.  Patient denies alcohol consumption, tobacco use, and illicit drug use.  Visit Diagnosis:    ICD-10-CM   1. Bipolar disorder, current episode depressed, severe, with psychotic features (HCC)  F31.5 QUEtiapine (SEROQUEL) 400 MG tablet    QUEtiapine (SEROQUEL) 100 MG tablet    2. PTSD (post-traumatic stress disorder)  F43.10 QUEtiapine (SEROQUEL) 100 MG tablet    3. Insomnia, unspecified type  G47.00 traZODone (DESYREL) 100 MG tablet    4. Panic disorder with agoraphobia  F40.01 ALPRAZolam (XANAX) 1 MG tablet      Past Psychiatric History:  Bipolar Disorder, Panic Disorder w/ Agoraphobia, PTSD, and Insomnia, and no history of Suicide Attempts, Self Injurious Behavior, or Psychiatric Hospitalizations.   Past Medical History:  Past Medical History:  Diagnosis Date   Abnormal Pap smear    f/u ok   Anemia    Anxiety    Asthma    Infection    Mental disorder    Trichimoniasis     Past Surgical History:  Procedure Laterality Date   INDUCED ABORTION     NO PAST SURGERIES      Family Psychiatric History:  Great Uncle (maternal) - Schizophrenia Great Aunt (maternal) - Schizophrenia   Family History:  Family History  Problem Relation Age of Onset   Hypertension Mother    Hyperlipidemia Mother    Hearing loss Father    Anesthesia problems Neg Hx    Other Neg Hx  Social History:  Social History   Socioeconomic History   Marital status: Single    Spouse name: Not on file   Number of children: Not on file   Years of education: Not on file   Highest education level: Not on file  Occupational History   Not on file  Tobacco Use   Smoking status: Every Day    Packs/day: .5    Types: Cigarettes   Smokeless tobacco: Never  Substance and Sexual Activity    Alcohol use: No   Drug use: Yes    Types: Marijuana    Comment: occ   Sexual activity: Yes    Birth control/protection: None  Other Topics Concern   Not on file  Social History Narrative   Not on file   Social Determinants of Health   Financial Resource Strain: Low Risk  (06/03/2022)   Overall Financial Resource Strain (CARDIA)    Difficulty of Paying Living Expenses: Not hard at all  Food Insecurity: No Food Insecurity (06/03/2022)   Hunger Vital Sign    Worried About Running Out of Food in the Last Year: Never true    Ran Out of Food in the Last Year: Never true  Transportation Needs: No Transportation Needs (06/03/2022)   PRAPARE - Administrator, Civil Service (Medical): No    Lack of Transportation (Non-Medical): No  Physical Activity: Insufficiently Active (06/03/2022)   Exercise Vital Sign    Days of Exercise per Week: 1 day    Minutes of Exercise per Session: 20 min  Stress: Stress Concern Present (06/03/2022)   [employer] of Occupational Health - Occupational Stress Questionnaire    Feeling of Stress : Very much  Social Connections: Moderately Isolated (06/03/2022)   Social Connection and Isolation Panel [NHANES]    Frequency of Communication with Friends and Family: More than three times a week    Frequency of Social Gatherings with Friends and Family: Once a week    Attends Religious Services: 1 to 4 times per year    Active Member of [employer] or Organizations: No    Attends Banker Meetings: Never    Marital Status: Never married    Allergies:  Allergies  Allergen Reactions   Sulfa Antibiotics Rash    Metabolic Disorder Labs: No results found for: "HGBA1C", "MPG" No results found for: "PROLACTIN" No results found for: "CHOL", "TRIG", "HDL", "CHOLHDL", "VLDL", "LDLCALC" No results found for: "TSH"  Therapeutic Level Labs: No results found for: "LITHIUM" No results found for: "VALPROATE" No results found for:  "CBMZ"  Current Medications: Current Outpatient Medications  Medication Sig Dispense Refill   albuterol (PROVENTIL HFA;VENTOLIN HFA) 108 (90 Base) MCG/ACT inhaler Inhale 1-2 puffs into the lungs every 6 (six) hours as needed for wheezing or shortness of breath. 1 Inhaler 0   ALPRAZolam (XANAX) 1 MG tablet Take 1 tablet (1 mg total) by mouth 3 (three) times daily as needed. 90 tablet 0   HYDROcodone-acetaminophen (NORCO/VICODIN) 5-325 MG tablet Take 1 tablet by mouth every 6 (six) hours as needed. 6 tablet 0   ibuprofen (ADVIL) 400 MG tablet Take 1 tablet (400 mg total) by mouth every 8 (eight) hours as needed for moderate pain. 21 tablet 0   QUEtiapine (SEROQUEL) 100 MG tablet Take 1 tablet (100 mg total) by mouth daily. 30 tablet 2   QUEtiapine (SEROQUEL) 400 MG tablet Take 1 tablet (400 mg total) by mouth at bedtime. 30 tablet 2   traZODone (DESYREL) 100 MG  tablet Take 1 tablet (100 mg total) by mouth daily. 30 tablet 2   No current facility-administered medications for this visit.     Musculoskeletal: Strength & Muscle Tone: within normal limits Gait & Station: normal Patient leans: N/A  Psychiatric Specialty Exam: Review of Systems  Psychiatric/Behavioral:  Positive for hallucinations. Negative for decreased concentration, dysphoric mood, self-injury, sleep disturbance and suicidal ideas. The patient is nervous/anxious. The patient is not hyperactive.     Blood pressure (!) 150/102, pulse 82, temperature 98.5 F (36.9 C), temperature source Oral, height 5\' 3"  (1.6 m), weight 190 lb (86.2 kg), SpO2 98 %, unknown if currently breastfeeding.Body mass index is 33.66 kg/m.  General Appearance: Casual  Eye Contact:  Fair  Speech:  Clear and Coherent and Normal Rate  Volume:  Normal  Mood:  Anxious  Affect:  Appropriate  Thought Process:  Coherent, Goal Directed, and Descriptions of Associations: Intact  Orientation:  Full (Time, Place, and Person)  Thought Content: WDL   Suicidal  Thoughts:  No  Homicidal Thoughts:  No  Memory:  Immediate;   Good Recent;   Good Remote;   Good  Judgement:  Fair  Insight:  Fair  Psychomotor Activity:  Normal  Concentration:  Concentration: Good and Attention Span: Good  Recall:  Good  Fund of Knowledge: Good  Language: Good  Akathisia:  No  Handed:  Left  AIMS (if indicated): not done  Assets:  Communication Skills Desire for Improvement Housing Resilience Social Support  ADL's:  Intact  Cognition: WNL  Sleep:  Good   Screenings: GAD-7    Flowsheet Row Clinical Support from 01/12/2023 in [facility] Counselor from 11/11/2022 in [facility] Counselor from 06/03/2022 in [facility] Office Visit from 02/24/2022 in [facility] Office Visit from 12/23/2021 in [facility]  Total GAD-7 Score 19 12 20 20 18       PHQ2-9    Flowsheet Row Clinical Support from 01/12/2023 in [facility] Counselor from 11/11/2022 in [facility] Counselor from 06/03/2022 in [facility] Office Visit from 02/24/2022 in [facility] Office Visit from 12/23/2021 in [facility]  PHQ-2 Total Score 0 0 6 3 5   PHQ-9 Total Score -- 4 18 18 22       Flowsheet Row Clinical Support from 01/12/2023 in [facility] Counselor from 11/11/2022 in [facility] Counselor from 06/03/2022 in [facility]  C-SSRS RISK CATEGORY No Risk No Risk No Risk        Assessment and Plan:   Verlin Duke. Adeyemi is a 32 year old, African-American female with a past psychiatric history significant for bipolar disorder (with psychotic features), PTSD, insomnia, and panic disorder with agoraphobia who presents to [facility] for follow-up and medication management.  Patient presents today encounter requesting refills on her medications stating that she missed her last appointment with her previous provider.  Patient reports that she continues to experience dreams and visions of the future and states that her medications are effective in managing her dressed that she experiences from having these visions.  Patient also endorses auditory hallucinations characterized by voices discussing what she is doing at the moment.  Patient denies dosage adjustments at this time and states that she is stable on her current medication regimen.  Patient's  medications to be e-prescribed to pharmacy of choice.  Collaboration of Care: Collaboration of Care: Medication Management AEB provider managing patient's psychiatric medications, Psychiatrist AEB patient being followed by mental health provider at this facility, and Referral or follow-up with counselor/therapist AEB patient being seen by a licensed clinical social worker at this facility  Patient/Guardian was advised Release of Information must be obtained prior to any record release in order to collaborate their care with an outside provider. Patient/Guardian was advised if they have not already done so to contact the registration department to sign all necessary forms in order for [place] to release information regarding their care.   Consent: Patient/Guardian gives verbal consent for treatment and assignment of benefits for services provided during this visit. Patient/Guardian expressed understanding and agreed to proceed.   1. Bipolar disorder, current episode depressed, severe, with psychotic features (HCC)  - QUEtiapine (SEROQUEL) 400 MG tablet; Take 1 tablet (400 mg total) by mouth at bedtime.  Dispense: 30 tablet; Refill: 2 - QUEtiapine (SEROQUEL) 100 MG tablet; Take 1 tablet (100 mg total) by mouth daily.  Dispense: 30 tablet; Refill: 2  2. PTSD  (post-traumatic stress disorder)  - QUEtiapine (SEROQUEL) 100 MG tablet; Take 1 tablet (100 mg total) by mouth daily.  Dispense: 30 tablet; Refill: 2  3. Insomnia, unspecified type  - traZODone (DESYREL) 100 MG tablet; Take 1 tablet (100 mg total) by mouth daily.  Dispense: 30 tablet; Refill: 2  4. Panic disorder with agoraphobia  - ALPRAZolam (XANAX) 1 MG tablet; Take 1 tablet (1 mg total) by mouth 3 (three) times daily as needed.  Dispense: 90 tablet; Refill: 0  Patient to follow-up in 2 months Provider spent a total of 15 minutes with the patient/reviewing patient's chart  Meta Hatchet, PA 01/12/2023, 9:06 AM

## 2023-02-02 ENCOUNTER — Ambulatory Visit (HOSPITAL_COMMUNITY): Payer: MEDICAID | Admitting: Mental Health

## 2023-02-24 ENCOUNTER — Telehealth (HOSPITAL_COMMUNITY): Payer: Self-pay | Admitting: Physician Assistant

## 2023-02-24 ENCOUNTER — Other Ambulatory Visit (HOSPITAL_COMMUNITY): Payer: Self-pay | Admitting: Psychiatry

## 2023-02-24 DIAGNOSIS — F4001 Agoraphobia with panic disorder: Secondary | ICD-10-CM

## 2023-02-24 MED ORDER — ALPRAZOLAM 1 MG PO TABS
1.0000 mg | ORAL_TABLET | Freq: Three times a day (TID) | ORAL | 0 refills | Status: DC | PRN
Start: 2023-02-24 — End: 2023-03-25

## 2023-02-24 NOTE — Telephone Encounter (Signed)
Medication sent to preferred pharmacy

## 2023-03-03 ENCOUNTER — Encounter (HOSPITAL_COMMUNITY): Payer: MEDICAID | Admitting: Physician Assistant

## 2023-03-05 NOTE — Telephone Encounter (Signed)
Thank you :)

## 2023-03-11 ENCOUNTER — Encounter (HOSPITAL_COMMUNITY): Payer: MEDICAID | Admitting: Physician Assistant

## 2023-03-24 ENCOUNTER — Ambulatory Visit (HOSPITAL_COMMUNITY): Payer: MEDICAID | Admitting: Mental Health

## 2023-03-25 ENCOUNTER — Ambulatory Visit (HOSPITAL_COMMUNITY): Payer: MEDICAID | Admitting: Physician Assistant

## 2023-03-25 ENCOUNTER — Encounter (HOSPITAL_COMMUNITY): Payer: Self-pay | Admitting: Physician Assistant

## 2023-03-25 DIAGNOSIS — F4310 Post-traumatic stress disorder, unspecified: Secondary | ICD-10-CM | POA: Diagnosis not present

## 2023-03-25 DIAGNOSIS — F315 Bipolar disorder, current episode depressed, severe, with psychotic features: Secondary | ICD-10-CM

## 2023-03-25 DIAGNOSIS — G4700 Insomnia, unspecified: Secondary | ICD-10-CM | POA: Diagnosis not present

## 2023-03-25 DIAGNOSIS — F4001 Agoraphobia with panic disorder: Secondary | ICD-10-CM | POA: Diagnosis not present

## 2023-03-25 MED ORDER — ALPRAZOLAM 1 MG PO TABS: 1.0000 mg | ORAL_TABLET | Freq: Three times a day (TID) | ORAL | 0 refills | Status: DC | PRN

## 2023-03-25 MED ORDER — QUETIAPINE FUMARATE 400 MG PO TABS
400.0000 mg | ORAL_TABLET | Freq: Every day | ORAL | 2 refills | Status: DC
Start: 2023-03-25 — End: 2023-04-30

## 2023-03-25 MED ORDER — QUETIAPINE FUMARATE 100 MG PO TABS: 100.0000 mg | ORAL_TABLET | Freq: Every day | ORAL | 2 refills | Status: DC

## 2023-03-25 MED ORDER — TRAZODONE HCL 100 MG PO TABS: 100.0000 mg | ORAL_TABLET | Freq: Every day | ORAL | 2 refills | Status: DC

## 2023-03-25 NOTE — Progress Notes (Signed)
BH MD/PA/NP OP Progress Note  03/25/2023 7:55 PM Amber Fitzpatrick  MRN:  [id number]  Chief Complaint:  Chief Complaint  Patient presents with   Follow-up   Medication Refill   HPI:   Amber Fitzpatrick is a 32 year old, African-American female with a past psychiatric history significant for bipolar disorder (with psychotic features), PTSD, insomnia, and panic disorder with agoraphobia who presents to [facility] for follow-up and medication management.  Patient is currently being managed on the following psychiatric medications:  Seroquel 100 mg daily Seroquel 400 mg at bedtime Trazodone 100 mg at bedtime Alprazolam (Xanax) 1 mg 3 times daily as needed  Patient reports that she has been well and states that her medication regimen has been going good.  Patient denies experiencing any adverse side effects and denies the need for dosage adjustments at this time.  Patient denies depressive symptoms and states that she has been extremely busy.  Patient reports that her anxiety is still elevated.  In regards to stressors, patient reports that there was a recent death in her family.  A PHQ-9 screen was performed with the patient scoring a 15.  A GAD-7 screen was also performed the patient scoring a 21.  Patient is alert and oriented x 4, calm, cooperative, and fully engaged in conversation during the encounter.  Patient endorses okay mood but states that she is ready to relax.  Patient denies suicidal or homicidal ideations.  Patient endorses auditory hallucinations but denies visual hallucinations at this time.  Patient does not appear to be responding to internal/external stimuli.  Patient endorses good sleep and receives on average 10 hours of sleep per night.  Patient endorses decreased appetite and eats on average 1 meal per day.  Patient denies alcohol consumption, tobacco use, or illicit drug use.  Visit Diagnosis:    ICD-10-CM   1. Bipolar disorder,  current episode depressed, severe, with psychotic features (HCC)  F31.5 QUEtiapine (SEROQUEL) 400 MG tablet    QUEtiapine (SEROQUEL) 100 MG tablet    2. PTSD (post-traumatic stress disorder)  F43.10 QUEtiapine (SEROQUEL) 100 MG tablet    3. Insomnia, unspecified type  G47.00 traZODone (DESYREL) 100 MG tablet    4. Panic disorder with agoraphobia  F40.01 ALPRAZolam (XANAX) 1 MG tablet       Past Psychiatric History:  Bipolar Disorder, Panic Disorder w/ Agoraphobia, PTSD, and Insomnia, and no history of Suicide Attempts, Self Injurious Behavior, or Psychiatric Hospitalizations.   Past Medical History:  Past Medical History:  Diagnosis Date   Abnormal Pap smear    f/u ok   Anemia    Anxiety    Asthma    Infection    Mental disorder    Trichimoniasis     Past Surgical History:  Procedure Laterality Date   INDUCED ABORTION     NO PAST SURGERIES      Family Psychiatric History:  Great Uncle (maternal) - Schizophrenia Great Aunt (maternal) - Schizophrenia   Family History:  Family History  Problem Relation Age of Onset   Hypertension Mother    Hyperlipidemia Mother    Hearing loss Father    Anesthesia problems Neg Hx    Other Neg Hx     Social History:  Social History   Socioeconomic History   Marital status: Single    Spouse name: Not on file   Number of children: Not on file   Years of education: Not on file   Highest education level: Not on  file  Occupational History   Not on file  Tobacco Use   Smoking status: Every Day    Current packs/day: 0.50    Types: Cigarettes   Smokeless tobacco: Never  Substance and Sexual Activity   Alcohol use: No   Drug use: Yes    Types: Marijuana    Comment: occ   Sexual activity: Yes    Birth control/protection: None  Other Topics Concern   Not on file  Social History Narrative   Not on file   Social Determinants of Health   Financial Resource Strain: Low Risk  (06/03/2022)   Overall Financial Resource Strain  (CARDIA)    Difficulty of Paying Living Expenses: Not hard at all  Food Insecurity: No Food Insecurity (06/03/2022)   Hunger Vital Sign    Worried About Running Out of Food in the Last Year: Never true    Ran Out of Food in the Last Year: Never true  Transportation Needs: No Transportation Needs (06/03/2022)   PRAPARE - Administrator, Civil Service (Medical): No    Lack of Transportation (Non-Medical): No  Physical Activity: Insufficiently Active (06/03/2022)   Exercise Vital Sign    Days of Exercise per Week: 1 day    Minutes of Exercise per Session: 20 min  Stress: Stress Concern Present (06/03/2022)   [employer] of Occupational Health - Occupational Stress Questionnaire    Feeling of Stress : Very much  Social Connections: Moderately Isolated (06/03/2022)   Social Connection and Isolation Panel [NHANES]    Frequency of Communication with Friends and Family: More than three times a week    Frequency of Social Gatherings with Friends and Family: Once a week    Attends Religious Services: 1 to 4 times per year    Active Member of [employer] or Organizations: No    Attends Banker Meetings: Never    Marital Status: Never married    Allergies:  Allergies  Allergen Reactions   Sulfa Antibiotics Rash    Metabolic Disorder Labs: No results found for: "HGBA1C", "MPG" No results found for: "PROLACTIN" No results found for: "CHOL", "TRIG", "HDL", "CHOLHDL", "VLDL", "LDLCALC" No results found for: "TSH"  Therapeutic Level Labs: No results found for: "LITHIUM" No results found for: "VALPROATE" No results found for: "CBMZ"  Current Medications: Current Outpatient Medications  Medication Sig Dispense Refill   albuterol (PROVENTIL HFA;VENTOLIN HFA) 108 (90 Base) MCG/ACT inhaler Inhale 1-2 puffs into the lungs every 6 (six) hours as needed for wheezing or shortness of breath. 1 Inhaler 0   [START ON 03/26/2023] ALPRAZolam (XANAX) 1 MG tablet Take 1 tablet (1  mg total) by mouth 3 (three) times daily as needed. 90 tablet 0   HYDROcodone-acetaminophen (NORCO/VICODIN) 5-325 MG tablet Take 1 tablet by mouth every 6 (six) hours as needed. 6 tablet 0   ibuprofen (ADVIL) 400 MG tablet Take 1 tablet (400 mg total) by mouth every 8 (eight) hours as needed for moderate pain. 21 tablet 0   QUEtiapine (SEROQUEL) 100 MG tablet Take 1 tablet (100 mg total) by mouth daily. 30 tablet 2   QUEtiapine (SEROQUEL) 400 MG tablet Take 1 tablet (400 mg total) by mouth at bedtime. 30 tablet 2   traZODone (DESYREL) 100 MG tablet Take 1 tablet (100 mg total) by mouth daily. 30 tablet 2   No current facility-administered medications for this visit.     Musculoskeletal: Strength & Muscle Tone: within normal limits Gait & Station: normal Patient leans: N/A  Psychiatric Specialty Exam: Review of Systems  Psychiatric/Behavioral:  Positive for hallucinations. Negative for decreased concentration, dysphoric mood, self-injury, sleep disturbance and suicidal ideas. The patient is nervous/anxious. The patient is not hyperactive.     Blood pressure (!) 126/98, pulse 70, temperature 98.3 F (36.8 C), temperature source Oral, height 5\' 3"  (1.6 m), weight 189 lb (85.7 kg), SpO2 100%, unknown if currently breastfeeding.Body mass index is 33.48 kg/m.  General Appearance: Casual  Eye Contact:  Fair  Speech:  Clear and Coherent and Normal Rate  Volume:  Normal  Mood:  Anxious  Affect:  Appropriate  Thought Process:  Coherent, Goal Directed, and Descriptions of Associations: Intact  Orientation:  Full (Time, Place, and Person)  Thought Content: WDL and Hallucinations: Auditory   Suicidal Thoughts:  No  Homicidal Thoughts:  No  Memory:  Immediate;   Good Recent;   Good Remote;   Good  Judgement:  Fair  Insight:  Fair  Psychomotor Activity:  Normal  Concentration:  Concentration: Good and Attention Span: Good  Recall:  Good  Fund of Knowledge: Good  Language: Good   Akathisia:  No  Handed:  Left  AIMS (if indicated): not done  Assets:  Communication Skills Desire for Improvement Housing Resilience Social Support  ADL's:  Intact  Cognition: WNL  Sleep:  Good   Screenings: GAD-7    Flowsheet Row Clinical Support from 03/25/2023 in [facility] Clinical Support from 01/12/2023 in [facility] Counselor from 11/11/2022 in [facility] Counselor from 06/03/2022 in [facility] Office Visit from 02/24/2022 in [facility]  Total GAD-7 Score 21 19 12 20 20       PHQ2-9    Flowsheet Row Clinical Support from 03/25/2023 in [facility] Clinical Support from 01/12/2023 in [facility] Counselor from 11/11/2022 in [facility] Counselor from 06/03/2022 in [facility] Office Visit from 02/24/2022 in [facility]  PHQ-2 Total Score 2 0 0 6 3  PHQ-9 Total Score 15 -- 4 18 18       Flowsheet Row Clinical Support from 03/25/2023 in [facility] Clinical Support from 01/12/2023 in [facility] Counselor from 11/11/2022 in [facility]  C-SSRS RISK CATEGORY No Risk No Risk No Risk        Assessment and Plan:   Jenai Buckelew. Goff is a 32 year old, African-American female with a past psychiatric history significant for bipolar disorder (with psychotic features), PTSD, insomnia, and panic disorder with agoraphobia who presents to [facility] for follow-up and medication management.  Patient presents today encounter reporting no issues or concerns regarding her current medication regimen.  Patient denies experiencing any adverse side effects at this time.  Patient denies overt  depressive symptoms at this time but states that her anxiety has been elevated stating that there was a death in her family recently.  Patient endorses stability on her current medication regimen and denies the need for dosage adjustments at this time.  Patient to continue taking medications as prescribed.  Patient's medications to be e-prescribed to pharmacy of choice.  Collaboration of Care: Collaboration of Care: Medication Management AEB provider managing patient's psychiatric medications, Psychiatrist AEB patient being followed by mental health provider at this facility, and Referral or follow-up with counselor/therapist AEB patient being seen by a licensed  clinical social worker at this facility  Patient/Guardian was advised Release of Information must be obtained prior to any record release in order to collaborate their care with an outside provider. Patient/Guardian was advised if they have not already done so to contact the registration department to sign all necessary forms in order for [place] to release information regarding their care.   Consent: Patient/Guardian gives verbal consent for treatment and assignment of benefits for services provided during this visit. Patient/Guardian expressed understanding and agreed to proceed.   1. Bipolar disorder, current episode depressed, severe, with psychotic features (HCC)  - QUEtiapine (SEROQUEL) 400 MG tablet; Take 1 tablet (400 mg total) by mouth at bedtime.  Dispense: 30 tablet; Refill: 2 - QUEtiapine (SEROQUEL) 100 MG tablet; Take 1 tablet (100 mg total) by mouth daily.  Dispense: 30 tablet; Refill: 2  2. PTSD (post-traumatic stress disorder)  - QUEtiapine (SEROQUEL) 100 MG tablet; Take 1 tablet (100 mg total) by mouth daily.  Dispense: 30 tablet; Refill: 2  3. Insomnia, unspecified type  - traZODone (DESYREL) 100 MG tablet; Take 1 tablet (100 mg total) by mouth daily.  Dispense: 30 tablet; Refill: 2  4. Panic disorder with agoraphobia  -  ALPRAZolam (XANAX) 1 MG tablet; Take 1 tablet (1 mg total) by mouth 3 (three) times daily as needed.  Dispense: 90 tablet; Refill: 0  Patient to follow-up in 2 months Provider spent a total of 16 minutes with the patient/reviewing patient's chart  Meta Hatchet, PA 03/25/2023, 7:55 PM

## 2023-04-22 ENCOUNTER — Ambulatory Visit (HOSPITAL_COMMUNITY): Payer: MEDICAID | Admitting: Mental Health

## 2023-04-22 ENCOUNTER — Encounter (HOSPITAL_COMMUNITY): Payer: Self-pay

## 2023-04-22 DIAGNOSIS — F4310 Post-traumatic stress disorder, unspecified: Secondary | ICD-10-CM

## 2023-04-22 DIAGNOSIS — F315 Bipolar disorder, current episode depressed, severe, with psychotic features: Secondary | ICD-10-CM

## 2023-04-22 NOTE — Progress Notes (Unsigned)
THERAPIST PROGRESS NOTE  Session Time: 2:06 pm  Participation Level: Active  Behavioral Response: CasualAlertEuthymic  Type of Therapy: Individual Therapy  Treatment Goals addressed:  Active     Anxiety Disorder CCP Problem  1 PTSD GAD     LTG: Fernando will score less than 5 on the Generalized Anxiety Disorder 7 Scale (GAD-7) (Progressing)     Start:  06/04/22    Expected End:  09/19/23         STG: Ladavia will increase management of anxiety/stress AEB development of x 3 effective coping skills with ability to reframe maladaptive thinking patterns daily within the next 6 months  (Progressing)     Start:  06/04/22    Expected End:  09/19/23      04/22/2023: Feliz Beam has not been able to engage in therapy in the past x 3 months due to lack of a Arts administrator. Shares ongoing concerns for ability to manage stressors. Identifies x 1 coping skill of use of social media but difficulty in identifying other means of coping. Shares inconsistent ability to reframe and challenge maladaptive thinking patterns. Will continue to monitor and support in increasing progress towards ability to manage stressors in effective manner.       Review results of GAD-7 with the patient to track progress     Start:  06/04/22         Work with patient to track symptoms, triggers and/or skill use through a mood chart, diary card, or journal     Start:  06/04/22         Perform psychoeducation regarding anxiety disorders     Start:  06/04/22         Provide patient with educational information and reading material on anxiety, its causes, and symptoms     Start:  06/04/22         Work with patient to identify the major components of a recent episode of anxiety: physical symptoms, major thoughts and images, and major behaviors they experienced     Start:  06/04/22         Work with patient to identify 3 personal goals for managing their anxiety to work on during current treatment     Start:   06/04/22         Create a weekly activity schedule     Start:  06/04/22              ProgressTowards Goals: Progressing  Interventions: Supportive  Summary:   BOB EASTWOOD is a 32 y.o. female who presents with dx of PTSD and bipolar disorder, current episode depressed. Presents alert and oriented; mood and affect adequate; stable. Speech clear and coherent. Thought process clear and coherent; vague at times. Denies extreme highs or lows and reports for moods to be stable. Shares has not been able ot present to appointments due to daughter being out of school. Shares events of daughters teaching making comments towards her and shares to have became defensive. Shares thoughts on stress from others for pressuring her about gaining formal employment. 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 Shares difficulty interacting with family and their assumptions of her. Shares hx of trauma and working on her healing. Shares ongoing difficulty with anxiety in small spaced and needs to be around open doors and windows. Engages with therapist review of goals and shares ongoing support in managing distress and over thinking with stress. Notes  engaging on soical media can be a coping skill for her and taking her medication. Denies safety concerns. Ongoing work with goals.  GAD: 10 PHQ: 10  Suicidal/Homicidal: Nowithout intent/plan  Therapist Response: Therapist engaged Dezzie in therapy session. Completed check in. Assessed for current level of functioning, sxs management and stressors. Engaged Heylee in exploring current stressors and ability to manage. Provided support and encouragement; validated feelings. Provided safe space to share ways in which trauma reactions have presented in her life and effects she fees it has had on her level of functioning. Explored presence of overthinking maladaptive thoughts and engaged in guided  discovery for more balanced thinking. Assessed for coping in distressful moments and setting of boundaries with others. Reviewed treatment plan, reviewed GAD and PHQ scores. Provided follow up.  Plan: Return again in  x 5 weeks.  Diagnosis: Bipolar disorder, current episode depressed, severe, with psychotic features (HCC)  PTSD (post-traumatic stress disorder)  Collaboration of Care: Other None  Patient/Guardian was advised Release of Information must be obtained prior to any record release in order to collaborate their care with an outside provider. Patient/Guardian was advised if they have not already done so to contact the registration department to sign all necessary forms in order for [place] to release information regarding their care.   Consent: Patient/Guardian gives verbal consent for treatment and assignment of benefits for services provided during this visit. Patient/Guardian expressed understanding and agreed to proceed.   Stephan Minister [place], [facility] 04/22/2023

## 2023-04-30 ENCOUNTER — Ambulatory Visit (INDEPENDENT_AMBULATORY_CARE_PROVIDER_SITE_OTHER): Payer: MEDICAID | Admitting: Physician Assistant

## 2023-04-30 DIAGNOSIS — F315 Bipolar disorder, current episode depressed, severe, with psychotic features: Secondary | ICD-10-CM | POA: Diagnosis not present

## 2023-04-30 DIAGNOSIS — G4700 Insomnia, unspecified: Secondary | ICD-10-CM

## 2023-04-30 DIAGNOSIS — F4001 Agoraphobia with panic disorder: Secondary | ICD-10-CM | POA: Diagnosis not present

## 2023-04-30 DIAGNOSIS — F4310 Post-traumatic stress disorder, unspecified: Secondary | ICD-10-CM | POA: Diagnosis not present

## 2023-04-30 MED ORDER — TRAZODONE HCL 100 MG PO TABS
100.0000 mg | ORAL_TABLET | Freq: Every day | ORAL | 2 refills | Status: DC
Start: 2023-04-30 — End: 2023-05-27

## 2023-04-30 MED ORDER — QUETIAPINE FUMARATE 100 MG PO TABS
100.0000 mg | ORAL_TABLET | Freq: Every day | ORAL | 2 refills | Status: DC
Start: 2023-04-30 — End: 2023-05-27

## 2023-04-30 MED ORDER — QUETIAPINE FUMARATE 400 MG PO TABS
400.0000 mg | ORAL_TABLET | Freq: Every day | ORAL | 2 refills | Status: DC
Start: 2023-04-30 — End: 2023-05-27

## 2023-04-30 MED ORDER — ALPRAZOLAM 1 MG PO TABS: 1.0000 mg | ORAL_TABLET | Freq: Three times a day (TID) | ORAL | 0 refills | Status: DC | PRN

## 2023-05-03 ENCOUNTER — Encounter (HOSPITAL_COMMUNITY): Payer: Self-pay | Admitting: Physician Assistant

## 2023-05-03 NOTE — Progress Notes (Signed)
BH MD/PA/NP OP Progress Note  05/03/2023 3:35 AM Amber Fitzpatrick Amber Fitzpatrick  MRN:  [id number]  Chief Complaint:  Chief Complaint  Patient presents with   Other    Walk-in   Medication Refill   HPI:   Amber Fitzpatrick is a 32 year old, African-American female with a past psychiatric history significant for bipolar disorder (with psychotic features), PTSD, insomnia, and panic disorder with agoraphobia who presents to [facility] for follow-up and medication management.  Patient is currently being managed on the following psychiatric medications:  Seroquel 100 mg daily Seroquel 400 mg at bedtime Trazodone 100 mg at bedtime Alprazolam (Xanax) 1 mg 3 times daily as needed  Patient presents to the encounter requesting medication refills.  She reports that when she last picked up her medications, she was not given her prescription for Seroquel 400 mg at bedtime.  Due to not receiving her Seroquel 400 mg, patient states that she has relied on taking Seroquel 100 mg at bedtime for the management of her mood and for sleep.  Despite being without her Seroquel 400 mg, patient denies issues or concerns with her current medication regimen.  She does endorse experiencing some nausea and vomiting if she has eaten prior to the use of her medications.  Patient denies overt depressive symptoms but does endorse some tiredness and worthlessness.  Patient also endorses worsening anxiety she rates at 9 out of 10.  Patient attributes her anxiety to her recent car trouble.  A GAD-7 screen was performed with the patient scoring a 5.  Patient is alert and oriented x 4, calm, cooperative, and fully engaged in conversation during the encounter.  Patient expresses being in a good mood; however, she wishes to not go to work due to being tired.  Patient denies suicidal or homicidal ideations.  She denies experiencing active auditory or visual hallucinations; however, she does report  auditory hallucinations in the past.  Patient does not appear to be responding to internal/external stimuli.  Patient endorses good sleep and receives on average 8 hours of sleep per night.  Patient endorses decreased appetite and eats on average 1/day.  Patient denies alcohol return, tobacco use, or illicit drug use.   Visit Diagnosis:    ICD-10-CM   1. Bipolar disorder, current episode depressed, severe, with psychotic features (HCC)  F31.5 QUEtiapine (SEROQUEL) 400 MG tablet    QUEtiapine (SEROQUEL) 100 MG tablet    2. PTSD (post-traumatic stress disorder)  F43.10 QUEtiapine (SEROQUEL) 100 MG tablet    3. Insomnia, unspecified type  G47.00 traZODone (DESYREL) 100 MG tablet    4. Panic disorder with agoraphobia  F40.01 ALPRAZolam (XANAX) 1 MG tablet       Past Psychiatric History:  Bipolar Disorder, Panic Disorder w/ Agoraphobia, PTSD, and Insomnia, and no history of Suicide Attempts, Self Injurious Behavior, or Psychiatric Hospitalizations.   Past Medical History:  Past Medical History:  Diagnosis Date   Abnormal Pap smear    f/u ok   Anemia    Anxiety    Asthma    Infection    Mental disorder    Trichimoniasis     Past Surgical History:  Procedure Laterality Date   INDUCED ABORTION     NO PAST SURGERIES      Family Psychiatric History:  Great Uncle (maternal) - Schizophrenia Great Aunt (maternal) - Schizophrenia   Family History:  Family History  Problem Relation Age of Onset   Hypertension Mother    Hyperlipidemia Mother  Hearing loss Father    Anesthesia problems Neg Hx    Other Neg Hx     Social History:  Social History   Socioeconomic History   Marital status: Single    Spouse name: Not on file   Number of children: Not on file   Years of education: Not on file   Highest education level: Not on file  Occupational History   Not on file  Tobacco Use   Smoking status: Every Day    Current packs/day: 0.50    Types: Cigarettes   Smokeless  tobacco: Never  Substance and Sexual Activity   Alcohol use: No   Drug use: Yes    Types: Marijuana    Comment: occ   Sexual activity: Yes    Birth control/protection: None  Other Topics Concern   Not on file  Social History Narrative   Not on file   Social Determinants of Health   Financial Resource Strain: Low Risk  (06/03/2022)   Overall Financial Resource Strain (CARDIA)    Difficulty of Paying Living Expenses: Not hard at all  Food Insecurity: Not on File (04/15/2023)   Received from [facility] Insecurity    Food: 0  Transportation Needs: No Transportation Needs (06/03/2022)   PRAPARE - Administrator, Civil Service (Medical): No    Lack of Transportation (Non-Medical): No  Physical Activity: Insufficiently Active (06/03/2022)   Exercise Vital Sign    Days of Exercise per Week: 1 day    Minutes of Exercise per Session: 20 min  Stress: Stress Concern Present (06/03/2022)   [employer] of Occupational Health - Occupational Stress Questionnaire    Feeling of Stress : Very much  Social Connections: Not on File (04/02/2023)   Received from [facility]   Social Connections    Connectedness: 0    Allergies:  Allergies  Allergen Reactions   Sulfa Antibiotics Rash    Metabolic Disorder Labs: No results found for: "HGBA1C", "MPG" No results found for: "PROLACTIN" No results found for: "CHOL", "TRIG", "HDL", "CHOLHDL", "VLDL", "LDLCALC" No results found for: "TSH"  Therapeutic Level Labs: No results found for: "LITHIUM" No results found for: "VALPROATE" No results found for: "CBMZ"  Current Medications: Current Outpatient Medications  Medication Sig Dispense Refill   albuterol (PROVENTIL HFA;VENTOLIN HFA) 108 (90 Base) MCG/ACT inhaler Inhale 1-2 puffs into the lungs every 6 (six) hours as needed for wheezing or shortness of breath. 1 Inhaler 0   ALPRAZolam (XANAX) 1 MG tablet Take 1 tablet (1 mg total) by mouth 3 (three) times daily as needed. 90  tablet 0   HYDROcodone-acetaminophen (NORCO/VICODIN) 5-325 MG tablet Take 1 tablet by mouth every 6 (six) hours as needed. 6 tablet 0   ibuprofen (ADVIL) 400 MG tablet Take 1 tablet (400 mg total) by mouth every 8 (eight) hours as needed for moderate pain. 21 tablet 0   QUEtiapine (SEROQUEL) 100 MG tablet Take 1 tablet (100 mg total) by mouth daily. 30 tablet 2   QUEtiapine (SEROQUEL) 400 MG tablet Take 1 tablet (400 mg total) by mouth at bedtime. 30 tablet 2   traZODone (DESYREL) 100 MG tablet Take 1 tablet (100 mg total) by mouth daily. 30 tablet 2   No current facility-administered medications for this visit.     Musculoskeletal: Strength & Muscle Tone: within normal limits Gait & Station: normal Patient leans: N/A  Psychiatric Specialty Exam: Review of Systems  Psychiatric/Behavioral:  Positive for hallucinations. Negative for decreased concentration, dysphoric  mood, self-injury, sleep disturbance and suicidal ideas. The patient is nervous/anxious. The patient is not hyperactive.     Blood pressure (!) 144/134, pulse 65, height 5' 3.5" (1.613 m), weight 190 lb 12.8 oz (86.5 kg), SpO2 100%, unknown if currently breastfeeding.Body mass index is 33.27 kg/m.  General Appearance: Casual  Eye Contact:  Fair  Speech:  Clear and Coherent and Normal Rate  Volume:  Normal  Mood:  Anxious  Affect:  Appropriate  Thought Process:  Coherent, Goal Directed, and Descriptions of Associations: Intact  Orientation:  Full (Time, Place, and Person)  Thought Content: WDL and Hallucinations: Auditory   Suicidal Thoughts:  No  Homicidal Thoughts:  No  Memory:  Immediate;   Good Recent;   Good Remote;   Good  Judgement:  Fair  Insight:  Fair  Psychomotor Activity:  Normal  Concentration:  Concentration: Good and Attention Span: Good  Recall:  Good  Fund of Knowledge: Good  Language: Good  Akathisia:  No  Handed:  Left  AIMS (if indicated): not done  Assets:  Communication Skills Desire for  Improvement Housing Resilience Social Support  ADL's:  Intact  Cognition: WNL  Sleep:  Good   Screenings: GAD-7    Flowsheet Row Clinical Support from 04/30/2023 in [facility] Counselor from 04/22/2023 in [facility] Clinical Support from 03/25/2023 in [facility] Clinical Support from 01/12/2023 in [facility] Counselor from 11/11/2022 in [facility]  Total GAD-7 Score 5 10 21 19 12       PHQ2-9    Flowsheet Row Clinical Support from 04/30/2023 in [facility] Counselor from 04/22/2023 in [facility] Clinical Support from 03/25/2023 in [facility] Clinical Support from 01/12/2023 in [facility] Counselor from 11/11/2022 in [facility]  PHQ-2 Total Score 0 2 2 0 0  PHQ-9 Total Score -- 10 15 -- 4      Flowsheet Row Clinical Support from 04/30/2023 in [facility] Clinical Support from 03/25/2023 in [facility] Clinical Support from 01/12/2023 in [facility]  C-SSRS RISK CATEGORY No Risk No Risk No Risk        Assessment and Plan:   Amber Fitzpatrick is a 32 year old, African-American female with a past psychiatric history significant for bipolar disorder (with psychotic features), PTSD, insomnia, and panic disorder with agoraphobia who presents to [facility] for follow-up and medication management.  Patient presents to the encounter stating that she was not able to receive her Seroquel 400 mg prescription.  Since being without her Seroquel 400 mg, patient reports that she has relied on Seroquel 100 mg for the management of her mood and sleep.  Despite not having her Seroquel, patient  reports no issues or concerns regarding her medication regimen.  Patient notes that she may experience occasional nausea/vomiting if she has not eaten prior to taking her medication.  She also endorses some increased appetite through the use of her Seroquel.  Patient denies overt depressive symptoms but does endorse worsening anxiety attributed to recent car trouble.  Patient endorses stability to her current medication regimen.  Provider to refill patient's medications, including her Seroquel 400 mg at bedtime.  Patient's medications to be e-prescribed to pharmacy of choice.  Collaboration of Care: Collaboration of Care: Medication Management AEB provider  managing patient's psychiatric medications, Psychiatrist AEB patient being followed by mental health provider at this facility, and Referral or follow-up with counselor/therapist AEB patient being seen by a licensed clinical social worker at this facility  Patient/Guardian was advised Release of Information must be obtained prior to any record release in order to collaborate their care with an outside provider. Patient/Guardian was advised if they have not already done so to contact the registration department to sign all necessary forms in order for [place] to release information regarding their care.   Consent: Patient/Guardian gives verbal consent for treatment and assignment of benefits for services provided during this visit. Patient/Guardian expressed understanding and agreed to proceed.   1. Bipolar disorder, current episode depressed, severe, with psychotic features (HCC)  - QUEtiapine (SEROQUEL) 400 MG tablet; Take 1 tablet (400 mg total) by mouth at bedtime.  Dispense: 30 tablet; Refill: 2 - QUEtiapine (SEROQUEL) 100 MG tablet; Take 1 tablet (100 mg total) by mouth daily.  Dispense: 30 tablet; Refill: 2  2. PTSD (post-traumatic stress disorder)  - QUEtiapine (SEROQUEL) 100 MG tablet; Take 1 tablet (100 mg total) by mouth daily.  Dispense:  30 tablet; Refill: 2  3. Insomnia, unspecified type  - traZODone (DESYREL) 100 MG tablet; Take 1 tablet (100 mg total) by mouth daily.  Dispense: 30 tablet; Refill: 2  4. Panic disorder with agoraphobia  - ALPRAZolam (XANAX) 1 MG tablet; Take 1 tablet (1 mg total) by mouth 3 (three) times daily as needed.  Dispense: 90 tablet; Refill: 0  Patient to follow-up on 05/27/2203 Provider spent a total of 15 minutes with the patient/reviewing patient's chart  Meta Hatchet, PA 05/03/2023, 3:35 AM

## 2023-05-27 ENCOUNTER — Ambulatory Visit (HOSPITAL_COMMUNITY): Payer: MEDICAID | Admitting: Physician Assistant

## 2023-05-27 ENCOUNTER — Encounter (HOSPITAL_COMMUNITY): Payer: Self-pay | Admitting: Physician Assistant

## 2023-05-27 DIAGNOSIS — F4310 Post-traumatic stress disorder, unspecified: Secondary | ICD-10-CM

## 2023-05-27 DIAGNOSIS — F315 Bipolar disorder, current episode depressed, severe, with psychotic features: Secondary | ICD-10-CM

## 2023-05-27 DIAGNOSIS — F4001 Agoraphobia with panic disorder: Secondary | ICD-10-CM

## 2023-05-27 DIAGNOSIS — G4700 Insomnia, unspecified: Secondary | ICD-10-CM | POA: Diagnosis not present

## 2023-05-27 MED ORDER — TRAZODONE HCL 100 MG PO TABS
100.0000 mg | ORAL_TABLET | Freq: Every day | ORAL | 2 refills | Status: DC
Start: 2023-05-27 — End: 2023-07-27

## 2023-05-27 MED ORDER — QUETIAPINE FUMARATE 100 MG PO TABS
100.0000 mg | ORAL_TABLET | Freq: Every day | ORAL | 2 refills | Status: DC
Start: 2023-05-27 — End: 2023-07-27

## 2023-05-27 MED ORDER — QUETIAPINE FUMARATE 400 MG PO TABS
400.0000 mg | ORAL_TABLET | Freq: Every day | ORAL | 2 refills | Status: DC
Start: 2023-05-27 — End: 2023-07-27

## 2023-05-27 MED ORDER — ALPRAZOLAM 1 MG PO TABS: 1.0000 mg | ORAL_TABLET | Freq: Three times a day (TID) | ORAL | 0 refills | Status: DC | PRN

## 2023-05-27 NOTE — Progress Notes (Signed)
BH MD/PA/NP OP Progress Note  05/27/2023 8:45 PM Amber Fitzpatrick  MRN:  [id number]  Chief Complaint:  Chief Complaint  Patient presents with   Follow-up   Medication Refill   HPI:   Amber Fitzpatrick is a 32 year old, African-American female with a past psychiatric history significant for bipolar disorder (with psychotic features), PTSD, insomnia, and panic disorder with agoraphobia who presents to [facility] for follow-up and medication management.  Patient is currently being managed on the following psychiatric medications:  Seroquel 100 mg daily Seroquel 400 mg at bedtime Trazodone 100 mg at bedtime Alprazolam (Xanax) 1 mg 3 times daily as needed  Patient presents today encounter reporting no issues or concerns regarding her current medication regimen.  Patient denies depressive episodes at this time.  Although they state that her anxiety is okay, she reports that her anxiety is always elevated.  She reports that she is always second-guessing herself or worried about something.  She attributes her elevated anxiety to a general mistrust of the world.  She denies any new stressors at this time.  Provider discussed with patient about tapering off her Xanax and trying an alternative medication for the management of her anxiety (Loreev XR --extended release lorazepam).  Provider reminded patient about the dangers of being on a benzodiazepine for an extended period of time.  Patient was not open to tapering off of Xanax or trying an alternative solution for the management of her anxiety.  She reports that in the past when she has tapered off her Xanax, things did not go well for her.  She reports that when she had been off her Xanax in the past, she had experienced multiple hospital admissions, irritability, and agitation.  She also reports that she would burst out of her sleep with elevated anxiety.  She also reports that she were to stop using Xanax,  and it would lead to marijuana use.  A GAD-7 screen was performed with the patient scoring a 15.  Patient is alert and oriented x 4, calm, cooperative, and fully engaged in conversation during the encounter.  Patient describes her mood as "blah" and states that she is bored.  Patient denies suicidal or homicidal ideations.  She further denies auditory or visual hallucinations and does not appear to be responding to internal/external stimuli.  Patient endorses good sleep and receives on average 8 hours of sleep per night.  Patient endorses decreased appetite and eats on average 1 meal per day.  Patient denied alcohol consumption, tobacco use, or illicit drug use.  Visit Diagnosis:    ICD-10-CM   1. Bipolar disorder, current episode depressed, severe, with psychotic features (HCC)  F31.5 QUEtiapine (SEROQUEL) 400 MG tablet    QUEtiapine (SEROQUEL) 100 MG tablet    2. PTSD (post-traumatic stress disorder)  F43.10 QUEtiapine (SEROQUEL) 100 MG tablet    3. Insomnia, unspecified type  G47.00 traZODone (DESYREL) 100 MG tablet    4. Panic disorder with agoraphobia  F40.01 ALPRAZolam (XANAX) 1 MG tablet      Past Psychiatric History:  Bipolar Disorder, Panic Disorder w/ Agoraphobia, PTSD, and Insomnia, and no history of Suicide Attempts, Self Injurious Behavior, or Psychiatric Hospitalizations.   Past Medical History:  Past Medical History:  Diagnosis Date   Abnormal Pap smear    f/u ok   Anemia    Anxiety    Asthma    Infection    Mental disorder    Trichimoniasis     Past Surgical  History:  Procedure Laterality Date   INDUCED ABORTION     NO PAST SURGERIES      Family Psychiatric History:  Great Uncle (maternal) - Schizophrenia Great Aunt (maternal) - Schizophrenia   Family History:  Family History  Problem Relation Age of Onset   Hypertension Mother    Hyperlipidemia Mother    Hearing loss Father    Anesthesia problems Neg Hx    Other Neg Hx     Social History:  Social  History   Socioeconomic History   Marital status: Single    Spouse name: Not on file   Number of children: Not on file   Years of education: Not on file   Highest education level: Not on file  Occupational History   Not on file  Tobacco Use   Smoking status: Every Day    Current packs/day: 0.50    Types: Cigarettes   Smokeless tobacco: Never  Substance and Sexual Activity   Alcohol use: No   Drug use: Yes    Types: Marijuana    Comment: occ   Sexual activity: Yes    Birth control/protection: None  Other Topics Concern   Not on file  Social History Narrative   Not on file   Social Determinants of Health   Financial Resource Strain: Low Risk  (06/03/2022)   Overall Financial Resource Strain (CARDIA)    Difficulty of Paying Living Expenses: Not hard at all  Food Insecurity: Not on File (04/15/2023)   Received from [facility] Insecurity    Food: 0  Transportation Needs: No Transportation Needs (06/03/2022)   PRAPARE - Administrator, Civil Service (Medical): No    Lack of Transportation (Non-Medical): No  Physical Activity: Insufficiently Active (06/03/2022)   Exercise Vital Sign    Days of Exercise per Week: 1 day    Minutes of Exercise per Session: 20 min  Stress: Stress Concern Present (06/03/2022)   [employer] of Occupational Health - Occupational Stress Questionnaire    Feeling of Stress : Very much  Social Connections: Not on File (04/02/2023)   Received from [facility]   Social Connections    Connectedness: 0    Allergies:  Allergies  Allergen Reactions   Sulfa Antibiotics Rash    Metabolic Disorder Labs: No results found for: "HGBA1C", "MPG" No results found for: "PROLACTIN" No results found for: "CHOL", "TRIG", "HDL", "CHOLHDL", "VLDL", "LDLCALC" No results found for: "TSH"  Therapeutic Level Labs: No results found for: "LITHIUM" No results found for: "VALPROATE" No results found for: "CBMZ"  Current Medications: Current  Outpatient Medications  Medication Sig Dispense Refill   albuterol (PROVENTIL HFA;VENTOLIN HFA) 108 (90 Base) MCG/ACT inhaler Inhale 1-2 puffs into the lungs every 6 (six) hours as needed for wheezing or shortness of breath. 1 Inhaler 0   [START ON 05/30/2023] ALPRAZolam (XANAX) 1 MG tablet Take 1 tablet (1 mg total) by mouth 3 (three) times daily as needed. 90 tablet 0   HYDROcodone-acetaminophen (NORCO/VICODIN) 5-325 MG tablet Take 1 tablet by mouth every 6 (six) hours as needed. 6 tablet 0   ibuprofen (ADVIL) 400 MG tablet Take 1 tablet (400 mg total) by mouth every 8 (eight) hours as needed for moderate pain. 21 tablet 0   QUEtiapine (SEROQUEL) 100 MG tablet Take 1 tablet (100 mg total) by mouth daily. 30 tablet 2   QUEtiapine (SEROQUEL) 400 MG tablet Take 1 tablet (400 mg total) by mouth at bedtime. 30 tablet 2  traZODone (DESYREL) 100 MG tablet Take 1 tablet (100 mg total) by mouth daily. 30 tablet 2   No current facility-administered medications for this visit.     Musculoskeletal: Strength & Muscle Tone: within normal limits Gait & Station: normal Patient leans: N/A  Psychiatric Specialty Exam: Review of Systems  Psychiatric/Behavioral:  Negative for decreased concentration, dysphoric mood, hallucinations, self-injury, sleep disturbance and suicidal ideas. The patient is nervous/anxious. The patient is not hyperactive.     Blood pressure (!) 146/120, pulse 73, temperature 97.6 F (36.4 C), temperature source Oral, height 5\' 3"  (1.6 m), weight 191 lb 3.2 oz (86.7 kg), SpO2 100%, unknown if currently breastfeeding.Body mass index is 33.87 kg/m.  General Appearance: Casual  Eye Contact:  Fair  Speech:  Clear and Coherent and Normal Rate  Volume:  Normal  Mood:  Anxious  Affect:  Appropriate  Thought Process:  Coherent, Goal Directed, and Descriptions of Associations: Intact  Orientation:  Full (Time, Place, and Person)  Thought Content: WDL   Suicidal Thoughts:  No   Homicidal Thoughts:  No  Memory:  Immediate;   Good Recent;   Good Remote;   Good  Judgement:  Fair  Insight:  Fair  Psychomotor Activity:  Normal  Concentration:  Concentration: Good and Attention Span: Good  Recall:  Good  Fund of Knowledge: Good  Language: Good  Akathisia:  No  Handed:  Left  AIMS (if indicated): not done  Assets:  Communication Skills Desire for Improvement Housing Resilience Social Support  ADL's:  Intact  Cognition: WNL  Sleep:  Good   Screenings: GAD-7    Flowsheet Row Clinical Support from 05/27/2023 in [facility] Clinical Support from 04/30/2023 in [facility] Counselor from 04/22/2023 in [facility] Clinical Support from 03/25/2023 in [facility] Clinical Support from 01/12/2023 in [facility]  Total GAD-7 Score 15 5 10 21 19       PHQ2-9    Flowsheet Row Clinical Support from 05/27/2023 in [facility] Clinical Support from 04/30/2023 in [facility] Counselor from 04/22/2023 in [facility] Clinical Support from 03/25/2023 in [facility] Clinical Support from 01/12/2023 in [facility]  PHQ-2 Total Score 0 0 2 2 0  PHQ-9 Total Score -- -- 10 15 --      Flowsheet Row Clinical Support from 05/27/2023 in [facility] Clinical Support from 04/30/2023 in [facility] Clinical Support from 03/25/2023 in [facility]  C-SSRS RISK CATEGORY No Risk No Risk No Risk        Assessment and Plan:   Amber Fitzpatrick is a 32 year old, African-American female with a past psychiatric history significant for bipolar disorder (with psychotic features), PTSD, insomnia, and panic disorder with agoraphobia  who presents to [facility] for follow-up and medication management.  Patient presents today encounter reporting no issues or concerns regarding her current medication regimen.  Patient denies experiencing depressive episodes.  She states that her anxiety is manageable; however, she admits to her anxiety always being elevated due to a general mistrust of the world.  Provider discussed with patient about whether or not she is open to tapering off her Xanax or trying a new alternative medication for the management of her anxiety (Loreev XR --extended release lorazepam).  Patient informed provider  that she was not open to tapering off of Xanax even when informed of the complications that can result from being on Xanax for an extended period of time.  She reported that in the past when she was taken off Xanax, she would often experience multiple hospitalizations and was extremely irritable and agitated.  Discussions were also made about reducing the dosage of patient's Seroquel; however, patient refused to have her Seroquel dosage adjusted.  Patient would like to continue taking her medications as prescribed but states that she may be interested in an alternative medication for the management of her anxiety.  Patient's medications to be prescribed to pharmacy of choice.  Collaboration of Care: Collaboration of Care: Medication Management AEB provider managing patient's psychiatric medications, Psychiatrist AEB patient being followed by mental health provider at this facility, and Referral or follow-up with counselor/therapist AEB patient being seen by a licensed clinical social worker at this facility  Patient/Guardian was advised Release of Information must be obtained prior to any record release in order to collaborate their care with an outside provider. Patient/Guardian was advised if they have not already done so to contact the registration department to sign all  necessary forms in order for [place] to release information regarding their care.   Consent: Patient/Guardian gives verbal consent for treatment and assignment of benefits for services provided during this visit. Patient/Guardian expressed understanding and agreed to proceed.   1. Bipolar disorder, current episode depressed, severe, with psychotic features (HCC)  - QUEtiapine (SEROQUEL) 400 MG tablet; Take 1 tablet (400 mg total) by mouth at bedtime.  Dispense: 30 tablet; Refill: 2 - QUEtiapine (SEROQUEL) 100 MG tablet; Take 1 tablet (100 mg total) by mouth daily.  Dispense: 30 tablet; Refill: 2  2. PTSD (post-traumatic stress disorder)  - QUEtiapine (SEROQUEL) 100 MG tablet; Take 1 tablet (100 mg total) by mouth daily.  Dispense: 30 tablet; Refill: 2  3. Insomnia, unspecified type  - traZODone (DESYREL) 100 MG tablet; Take 1 tablet (100 mg total) by mouth daily.  Dispense: 30 tablet; Refill: 2  4. Panic disorder with agoraphobia  - ALPRAZolam (XANAX) 1 MG tablet; Take 1 tablet (1 mg total) by mouth 3 (three) times daily as needed.  Dispense: 90 tablet; Refill: 0  Patient to follow-up in 2 months Provider spent a total of 27 minutes with the patient/reviewing patient's chart  Meta Hatchet, PA 05/27/2023, 8:45 PM

## 2023-06-29 ENCOUNTER — Telehealth (HOSPITAL_COMMUNITY): Payer: Self-pay | Admitting: Physician Assistant

## 2023-06-29 ENCOUNTER — Ambulatory Visit (HOSPITAL_COMMUNITY): Payer: MEDICAID | Admitting: Mental Health

## 2023-06-29 DIAGNOSIS — F315 Bipolar disorder, current episode depressed, severe, with psychotic features: Secondary | ICD-10-CM | POA: Diagnosis not present

## 2023-06-29 DIAGNOSIS — F4310 Post-traumatic stress disorder, unspecified: Secondary | ICD-10-CM

## 2023-06-29 DIAGNOSIS — F4001 Agoraphobia with panic disorder: Secondary | ICD-10-CM

## 2023-06-29 NOTE — Progress Notes (Signed)
   THERAPIST PROGRESS NOTE  Session Time: 10:10 am (37 minutes)  Participation Level: Active  Behavioral Response: CasualAlertStable  Type of Therapy: Individual Therapy  Treatment Goals addressed: STG: [name] will increase management of anxiety/stress AEB development of x 3 effective coping skills with ability to reframe maladaptive thinking patterns daily within the next 6 months   ProgressTowards Goals: Progressing  Interventions: CBT and Supportive  Summary:  Amber Fitzpatrick is a 32 y.o. female who presents with dx of PTSD and bipolar disorder, current episode depressed. Presents alert and oriented; mood and affect adequate; stable. Speech clear and coherent. Thought process clear and coherent; vague at times and at moments can be difficulty follow train of thought. Denies extreme highs or lows and reports for moods to be stable. Shares to be medication complaint. Shares thoughts of ongoing work and shares can have difficult times with others and them wanting her to be more social and allowing her daughter to be with family members. Shares thoughts on this and interactions with step-father who has a hx of drinking and relationship with her mother. Explores feelings of trust and levels of trust with therapist. 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 Denies safety concerns. Mood stable, continues to work to reframe distorted thoughts and engagement in coping skills reported.   Suicidal/Homicidal: Nowithout intent/plan  Therapist Response:  Therapist engaged [name] in therapy session. Completed check in. Assessed for current level of functioning, sxs management and stressors. Engaged [name] in exploring current stressors and ability to manage. Provided support and encouragement; validated feelings. Explored current levels of socialization and presence of natural support with friendships and thoughts on romantic relationships.  Supported in processing concerns for daughter to be with family members and explored feelings of trust and different levels/areas of trust. Reviewed session and provided follow up.   Plan: Return again in  x 8 weeks.  Diagnosis: Bipolar disorder, current episode depressed, severe, with psychotic features (HCC)  PTSD (post-traumatic stress disorder)  Panic disorder with agoraphobia  Collaboration of Care: Other None  Patient/Guardian was advised Release of Information must be obtained prior to any record release in order to collaborate their care with an outside provider. Patient/Guardian was advised if they have not already done so to contact the registration department to sign all necessary forms in order for [place] to release information regarding their care.   Consent: Patient/Guardian gives verbal consent for treatment and assignment of benefits for services provided during this visit. Patient/Guardian expressed understanding and agreed to proceed.   Stephan Minister [place], [facility] 06/29/2023

## 2023-06-30 ENCOUNTER — Other Ambulatory Visit (HOSPITAL_COMMUNITY): Payer: Self-pay | Admitting: Physician Assistant

## 2023-06-30 DIAGNOSIS — F4001 Agoraphobia with panic disorder: Secondary | ICD-10-CM

## 2023-06-30 MED ORDER — ALPRAZOLAM 1 MG PO TABS: 1.0000 mg | ORAL_TABLET | Freq: Three times a day (TID) | ORAL | 0 refills | Status: DC | PRN

## 2023-06-30 NOTE — Progress Notes (Signed)
Provider was contacted by Jacky Kindle regarding patient's request for all medications to be refilled.  Per chart review, the only medication that need to be refilled for the patient for her alprazolam.  Her other medications currently have refills available to her.  Patient's medication to be e-prescribed to pharmacy of choice.

## 2023-06-30 NOTE — Telephone Encounter (Signed)
Provider was contacted by Jacky Kindle regarding patient's request for all medications to be refilled.  Per chart review, the only medication that need to be refilled for the patient for her alprazolam.  Her other medications currently have refills available to her.  Patient's medication to be e-prescribed to pharmacy of choice.

## 2023-07-27 ENCOUNTER — Ambulatory Visit (HOSPITAL_COMMUNITY): Payer: MEDICAID | Admitting: Physician Assistant

## 2023-07-27 ENCOUNTER — Encounter (HOSPITAL_COMMUNITY): Payer: Self-pay | Admitting: Physician Assistant

## 2023-07-27 VITALS — BP 151/123 | HR 93 | Temp 97.4°F | Ht 63.0 in | Wt 200.4 lb

## 2023-07-27 DIAGNOSIS — F315 Bipolar disorder, current episode depressed, severe, with psychotic features: Secondary | ICD-10-CM | POA: Diagnosis not present

## 2023-07-27 DIAGNOSIS — Z79899 Other long term (current) drug therapy: Secondary | ICD-10-CM | POA: Insufficient documentation

## 2023-07-27 DIAGNOSIS — F4310 Post-traumatic stress disorder, unspecified: Secondary | ICD-10-CM

## 2023-07-27 DIAGNOSIS — G4700 Insomnia, unspecified: Secondary | ICD-10-CM | POA: Diagnosis not present

## 2023-07-27 DIAGNOSIS — F4001 Agoraphobia with panic disorder: Secondary | ICD-10-CM

## 2023-07-27 MED ORDER — TRAZODONE HCL 100 MG PO TABS
100.0000 mg | ORAL_TABLET | Freq: Every day | ORAL | 2 refills | Status: DC
Start: 2023-07-27 — End: 2023-09-15

## 2023-07-27 MED ORDER — ALPRAZOLAM 0.5 MG PO TABS: 0.5000 mg | ORAL_TABLET | Freq: Three times a day (TID) | ORAL | 0 refills | Status: DC | PRN

## 2023-07-27 MED ORDER — QUETIAPINE FUMARATE 100 MG PO TABS
100.0000 mg | ORAL_TABLET | Freq: Every day | ORAL | 2 refills | Status: DC
Start: 2023-07-27 — End: 2023-09-15

## 2023-07-27 MED ORDER — QUETIAPINE FUMARATE 400 MG PO TABS
400.0000 mg | ORAL_TABLET | Freq: Every day | ORAL | 2 refills | Status: DC
Start: 2023-07-27 — End: 2023-09-15

## 2023-07-27 NOTE — Progress Notes (Signed)
 [facility] MD/PA/NP OP Progress Note  07/27/2023 9:36 PM Amber Fitzpatrick  MRN:  [id number]  Chief Complaint:  Chief Complaint  Patient presents with   Follow-up   Medication Management   HPI:   Amber Fitzpatrick is a 33 year old, African-American female with a past psychiatric history significant for bipolar disorder (with psychotic features), PTSD, insomnia, and panic disorder with agoraphobia who presents to [facility] for follow-up and medication management.  Patient is currently being managed on the following psychiatric medications:  Seroquel  100 mg daily Seroquel  400 mg at bedtime Trazodone  100 mg at bedtime Alprazolam  (Xanax ) 1 mg 3 times daily as needed  Patient reports that she has been taking her medications regularly.  She reports that she takes most of her medications at night.  She reports that her medications have been effective.  She endorses minimal depression but states that she occasionally experiences fatigue.  Patient denies experiencing any manic episodes such as increased energy, decreased sleep,, or mood swings.  Patient does endorse anxiety and rates her anxiety at 10 out of 10.  She states that her anxiety is elevated in social situations such as appointments or when being around people.  When not in stressful situations, patient rates her anxiety at 3 out of 10.  She reports that her use of Xanax  has been effective in managing her anxiety.  She reports that she tries not to utilize her Xanax  when she does not have to.  Patient reports that she may use her Xanax  roughly 1 time per day.   Since patient admits to using her Xanax  roughly once a day, provider recommended decreasing her dosage from 1 mg to 0.5 mg 3 times daily as needed.  In addition to lowering her dose of Xanax , provider recommended patient take hydroxyzine  10 mg 3 times daily as needed for the management of her anxiety.  Provider also informed patient that a urine  drug screen would need to be obtained in order for her to continue her use of Xanax .  Patient became irritable when informed that a urine drug screen would need to be obtained.  Patient questioned the reason for the drug screen.  After providing the reason for the drug screen, patient refused to go to [facility] to have her urine drug screen done stating that it was against her religion and that she just does not want to do it.  Provider informed patient that since she is not willing to do a urine drug screen, then she would be tapered off her Xanax .  Patient vocalized understanding.  Patient hesitant but agreeable to recommendation of starting hydroxyzine . -If hydroxyzine  is ineffective consider propranolol, gabapentin, antidepressant, or adjustments in seroquel  -Patient has failed buspar  in the past  Patient is alert and oriented x 4, calm, cooperative, and fully engaged in conversation during the encounter.  Though the patient was irritable about having to provide a urine for drug screen, she endorsed fine mood.  Patient expressed concerns about her anxiety not being managed by other anxiolytic alternatives.  Patient denies suicidal or homicidal ideations.  She further denies auditory or visual hallucinations and does not appear to be responding to internal/external stimuli.  Patient endorses good sleep and receives on average 8 hours of sleep per night.  Patient endorses decreased appetite and eats on average 1 meal per day.  Patient denies alcohol consumption, tobacco use, or illicit drug use.  Visit Diagnosis:    ICD-10-CM   1. Long term current use  of antipsychotic medication  Z79.899 CBC with Differential    Lipid Profile    Comprehensive Metabolic Panel (CMET)    Hepatic function panel    Thyroid Panel With TSH    HgB A1c    2. Bipolar disorder, current episode depressed, severe, with psychotic features (HCC)  F31.5 QUEtiapine  (SEROQUEL ) 400 MG tablet    QUEtiapine  (SEROQUEL ) 100 MG tablet     3. PTSD (post-traumatic stress disorder)  F43.10 QUEtiapine  (SEROQUEL ) 100 MG tablet    4. Insomnia, unspecified type  G47.00 traZODone  (DESYREL ) 100 MG tablet    5. Panic disorder with agoraphobia  F40.01 Rapid urine drug screen (hospital performed)    ALPRAZolam  (XANAX ) 0.5 MG tablet      Past Psychiatric History:  Bipolar Disorder, Panic Disorder w/ Agoraphobia, PTSD, and Insomnia, and no history of Suicide Attempts, Self Injurious Behavior, or Psychiatric Hospitalizations.   Past Medical History:  Past Medical History:  Diagnosis Date   Abnormal Pap smear    f/u ok   Anemia    Anxiety    Asthma    Infection    Mental disorder    Trichimoniasis     Past Surgical History:  Procedure Laterality Date   INDUCED ABORTION     NO PAST SURGERIES      Family Psychiatric History:  Great Uncle (maternal) - Schizophrenia Great Aunt (maternal) - Schizophrenia   Family History:  Family History  Problem Relation Age of Onset   Hypertension Mother    Hyperlipidemia Mother    Hearing loss Father    Anesthesia problems Neg Hx    Other Neg Hx     Social History:  Social History   Socioeconomic History   Marital status: Single    Spouse name: Not on file   Number of children: Not on file   Years of education: Not on file   Highest education level: Not on file  Occupational History   Not on file  Tobacco Use   Smoking status: Every Day    Current packs/day: 0.50    Types: Cigarettes   Smokeless tobacco: Never  Substance and Sexual Activity   Alcohol use: No   Drug use: Yes    Types: Marijuana    Comment: occ   Sexual activity: Yes    Birth control/protection: None  Other Topics Concern   Not on file  Social History Narrative   Not on file   Social Drivers of Health   Financial Resource Strain: Low Risk  (06/03/2022)   Overall Financial Resource Strain (CARDIA)    Difficulty of Paying Living Expenses: Not hard at all  Food Insecurity: Not on File  (04/15/2023)   Received from [facility] Insecurity    Food: 0  Transportation Needs: No Transportation Needs (06/03/2022)   PRAPARE - Administrator, Civil Service (Medical): No    Lack of Transportation (Non-Medical): No  Physical Activity: Insufficiently Active (06/03/2022)   Exercise Vital Sign    Days of Exercise per Week: 1 day    Minutes of Exercise per Session: 20 min  Stress: Stress Concern Present (06/03/2022)   [employer] of Occupational [employer] - Occupational Stress Questionnaire    Feeling of Stress : Very much  Social Connections: Not on File (04/02/2023)   Received from [facility]   Social Connections    Connectedness: 0    Allergies:  Allergies  Allergen Reactions   Sulfa Antibiotics Rash    Metabolic Disorder Labs: No results  found for: HGBA1C, MPG No results found for: PROLACTIN No results found for: CHOL, TRIG, HDL, CHOLHDL, VLDL, LDLCALC No results found for: TSH  Therapeutic Level Labs: No results found for: LITHIUM No results found for: VALPROATE No results found for: CBMZ  Current Medications: Current Outpatient Medications  Medication Sig Dispense Refill   albuterol  (PROVENTIL  HFA;VENTOLIN  HFA) 108 (90 Base) MCG/ACT inhaler Inhale 1-2 puffs into the lungs every 6 (six) hours as needed for wheezing or shortness of breath. 1 Inhaler 0   [START ON 07/30/2023] ALPRAZolam  (XANAX ) 0.5 MG tablet Take 1 tablet (0.5 mg total) by mouth 3 (three) times daily as needed. 14 tablet 0   HYDROcodone -acetaminophen  (NORCO/VICODIN) 5-325 MG tablet Take 1 tablet by mouth every 6 (six) hours as needed. 6 tablet 0   ibuprofen  (ADVIL ) 400 MG tablet Take 1 tablet (400 mg total) by mouth every 8 (eight) hours as needed for moderate pain. 21 tablet 0   QUEtiapine  (SEROQUEL ) 100 MG tablet Take 1 tablet (100 mg total) by mouth at bedtime. 30 tablet 2   QUEtiapine  (SEROQUEL ) 400 MG tablet Take 1 tablet (400 mg total) by mouth at bedtime. 30  tablet 2   traZODone  (DESYREL ) 100 MG tablet Take 1 tablet (100 mg total) by mouth daily. 30 tablet 2   No current facility-administered medications for this visit.     Musculoskeletal: Strength & Muscle Tone: within normal limits Gait & Station: normal Patient leans: N/A  Psychiatric Specialty Exam: Review of Systems  Psychiatric/Behavioral:  Negative for decreased concentration, dysphoric mood, hallucinations, self-injury, sleep disturbance and suicidal ideas. The patient is nervous/anxious. The patient is not hyperactive.     Blood pressure (!) 151/123, pulse 93, temperature (!) 97.4 F (36.3 C), temperature source Oral, height 5' 3 (1.6 m), weight 200 lb 6.4 oz (90.9 kg), SpO2 100%, unknown if currently breastfeeding.Body mass index is 35.5 kg/m.  General Appearance: Casual  Eye Contact:  Fair  Speech:  Clear and Coherent and Normal Rate  Volume:  Normal  Mood:  Anxious  Affect:  Appropriate  Thought Process:  Coherent, Goal Directed, and Descriptions of Associations: Intact  Orientation:  Full (Time, Place, and Person)  Thought Content: WDL   Suicidal Thoughts:  No  Homicidal Thoughts:  No  Memory:  Immediate;   Good Recent;   Good Remote;   Good  Judgement:  Fair  Insight:  Fair  Psychomotor Activity:  Normal  Concentration:  Concentration: Good and Attention Span: Good  Recall:  Good  Fund of Knowledge: Good  Language: Good  Akathisia:  No  Handed:  Left  AIMS (if indicated): not done  Assets:  Communication Skills Desire for Improvement Housing Resilience Social Support  ADL's:  Intact  Cognition: WNL  Sleep:  Good   Screenings: GAD-7    Flowsheet Row Clinical Support from 07/27/2023 in [facility] Clinical Support from 05/27/2023 in [facility] Clinical Support from 04/30/2023 in [facility] Counselor from 04/22/2023 in [facility] Clinical  Support from 03/25/2023 in [facility]  Total GAD-7 Score 0 15 5 10 21       PHQ2-9    Flowsheet Row Clinical Support from 07/27/2023 in [facility] Clinical Support from 05/27/2023 in [facility] Clinical Support from 04/30/2023 in [facility] Counselor from 04/22/2023 in [facility] Clinical Support from 03/25/2023 in [facility]  PHQ-2 Total Score 0 0 0 2 2  PHQ-9 Total Score -- -- -- 10 15      Flowsheet Row Clinical Support from 07/27/2023 in [facility] Clinical Support from 05/27/2023 in [facility] Clinical Support from 04/30/2023 in [facility]  C-SSRS RISK CATEGORY No Risk No Risk No Risk        Assessment and Plan:   Amber Fitzpatrick is a 33 year old, African-American female with a past psychiatric history significant for bipolar disorder (with psychotic features), PTSD, insomnia, and panic disorder with agoraphobia who presents to [facility] for follow-up and medication management.  Patient presents to the encounter stating that she has been taking her medications regularly.  Patient endorses minimal depressive symptoms.  She denies experiencing any manic symptoms at this time.  Patient's anxiety was assessed with patient stating that her anxiety tends to be elevated in social situations.  When not in social situations, patient reports that her anxiety is fairly manageable.  Patient reports that she is still continuing to take her Xanax  and states that she takes her Xanax  roughly once a day.  Provider discussed with patient that her manage dosage may be too high for the level of anxiety she is experiencing.  Provider recommended decreasing her dosage from 1 mg 3 times daily as needed to 0.5 mg  3 times daily as needed.  Provider also recommended patient being on hydroxyzine  10 mg 3 times daily as needed for the management of her anxiety.  Patient hesitant but agreeable to recommendation. -If hydroxyzine  is ineffective consider propranolol, gabapentin, antidepressant, or adjustments in seroquel  -Patient has failed buspar  in the past  Provider informed patient that a drug screen would need to be obtained due to being on a controlled substance.  Patient refused to provide a urine drug screen.  Provider informed patient that since she refused to provide a urine drug screen, then she would be tapered off her Xanax .  Patient vocalized understanding.  Provider to taper patient off Xanax .  Patient's tapering plan is as follows.  -Xanax  0.5 mg 3 times daily as needed for 2 weeks  -Xanax  0.5 mg 2 times daily as needed for 2 weeks  -Xanax  0.25 mg 3 times daily as needed for 2 weeks  -Xanax  0.25 mg 2 times daily for as needed for 2 weeks  -Xanax  0.25 mg as needed for 2 weeks Provider to continue monitoring patient while tapering off Xanax .  Provider to obtain the following labs from the patient: Lipid panel, thyroid panel with TSH hemoglobin A1c, CMP, hepatic function panel, and CBC with differential.  Collaboration of Care: Collaboration of Care: Medication Management AEB provider managing patient's psychiatric medications, Psychiatrist AEB patient being followed by mental health provider at this facility, and Referral or follow-up with counselor/therapist AEB patient being seen by a licensed clinical social worker at this facility  Patient/Guardian was advised Release of Information must be obtained prior to any record release in order to collaborate their care with an outside provider. Patient/Guardian was advised if they have not already done so to contact the registration department to sign all necessary forms in order for us  to release information regarding their care.   Consent:  Patient/Guardian gives verbal consent for treatment and assignment of benefits for services provided during this visit. Patient/Guardian expressed understanding and agreed to proceed.   1. Bipolar disorder, current episode depressed, severe, with psychotic features (HCC)  -  QUEtiapine  (SEROQUEL ) 400 MG tablet; Take 1 tablet (400 mg total) by mouth at bedtime.  Dispense: 30 tablet; Refill: 2 - QUEtiapine  (SEROQUEL ) 100 MG tablet; Take 1 tablet (100 mg total) by mouth at bedtime.  Dispense: 30 tablet; Refill: 2  2. PTSD (post-traumatic stress disorder)  - QUEtiapine  (SEROQUEL ) 100 MG tablet; Take 1 tablet (100 mg total) by mouth at bedtime.  Dispense: 30 tablet; Refill: 2  3. Insomnia, unspecified type  - traZODone  (DESYREL ) 100 MG tablet; Take 1 tablet (100 mg total) by mouth daily.  Dispense: 30 tablet; Refill: 2  4. Panic disorder with agoraphobia  - Rapid urine drug screen (hospital performed); Future - ALPRAZolam  (XANAX ) 0.5 MG tablet; Take 1 tablet (0.5 mg total) by mouth 3 (three) times daily as needed.  Dispense: 42 tablet; Refill: 0 - hydrOXYzine  (ATARAX ) 10 MG tablet; Take 1 tablet (10 mg total) by mouth 3 (three) times daily as needed.  Dispense: 75 tablet; Refill: 1  5. Long term current use of antipsychotic medication (Primary)  - CBC with Differential; Future - Lipid Profile; Future - Comprehensive Metabolic Panel (CMET); Future - Hepatic function panel; Future - Thyroid Panel With TSH; Future - HgB A1c; Future  Patient to follow-up in 2 months Provider spent a total of 36 minutes with the patient/reviewing patient's chart  Reginia FORBES Bolster, PA 07/27/2023, 9:36 PM

## 2023-07-29 MED ORDER — HYDROXYZINE HCL 10 MG PO TABS: 10.0000 mg | ORAL_TABLET | Freq: Three times a day (TID) | ORAL | 1 refills | Status: DC | PRN

## 2023-07-29 MED ORDER — ALPRAZOLAM 0.5 MG PO TABS: 0.5000 mg | ORAL_TABLET | Freq: Three times a day (TID) | ORAL | 0 refills | Status: DC | PRN

## 2023-09-01 ENCOUNTER — Telehealth (HOSPITAL_COMMUNITY): Payer: MEDICAID | Admitting: Physician Assistant

## 2023-09-01 ENCOUNTER — Encounter (HOSPITAL_COMMUNITY): Payer: Self-pay

## 2023-09-01 ENCOUNTER — Telehealth (HOSPITAL_COMMUNITY): Payer: Self-pay | Admitting: Physician Assistant

## 2023-09-01 NOTE — Telephone Encounter (Signed)
Provider attempted to conduct appointment that was scheduled for today but patient no-showed.  Patient has an appointment scheduled for 09/21/2023.  Provider to reach out to patient prior to her next appointment to discuss her use of Xanax.  See latest encounter note regarding prior history of Xanax use.

## 2023-09-13 ENCOUNTER — Telehealth (HOSPITAL_COMMUNITY): Payer: Self-pay

## 2023-09-13 NOTE — Telephone Encounter (Signed)
 Medication refill - Met with patient at the [facility] outpatient as she presented with anxious affect, level mood but requested Otila Back, PA call in her Quetiapine as she is out of this currently. 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  Patient showed this nurse the mychart messages from PA stating he was late and reported she had her PCP give her a few days of the Quetiapine until she could get back to the [facility] to request Otila Back, PA call in a new order.  Patient stated she was not as upset about the Alprazolam and needing to get a random UDS to continue this medication as she really just needed the Quetiapine to be filled as only has a few days left from bridge her PCP provided.  Patient stated knowing about the appointment coming up 09/21/23 but needs the medication refill prior to then. Informed patient this nurse would send a message to PA and request he call her tomorrow to discuss and to send in a new Quetiapine prescription for her to her [facility] on [address] and patient agreed with plan.  Encouraged patient to discuss her concerns regarding her disability pending hearing as she was concerned PA had written so much about her being better but she stated did not mention that the medications she is currently taking is what is helping and keeping her doing better and patient agreed with this plan.

## 2023-09-14 ENCOUNTER — Ambulatory Visit (HOSPITAL_COMMUNITY): Payer: MEDICAID | Admitting: Mental Health

## 2023-09-15 ENCOUNTER — Other Ambulatory Visit (HOSPITAL_COMMUNITY): Payer: Self-pay | Admitting: Physician Assistant

## 2023-09-15 DIAGNOSIS — F4001 Agoraphobia with panic disorder: Secondary | ICD-10-CM

## 2023-09-15 DIAGNOSIS — F4310 Post-traumatic stress disorder, unspecified: Secondary | ICD-10-CM

## 2023-09-15 DIAGNOSIS — F315 Bipolar disorder, current episode depressed, severe, with psychotic features: Secondary | ICD-10-CM

## 2023-09-15 DIAGNOSIS — G4700 Insomnia, unspecified: Secondary | ICD-10-CM

## 2023-09-15 MED ORDER — HYDROXYZINE HCL 10 MG PO TABS: 10.0000 mg | ORAL_TABLET | Freq: Three times a day (TID) | ORAL | 1 refills | Status: AC | PRN

## 2023-09-15 MED ORDER — TRAZODONE HCL 100 MG PO TABS: 100.0000 mg | ORAL_TABLET | Freq: Every day | ORAL | 2 refills | Status: DC

## 2023-09-15 MED ORDER — QUETIAPINE FUMARATE 400 MG PO TABS: 400.0000 mg | ORAL_TABLET | Freq: Every day | ORAL | 2 refills | Status: DC

## 2023-09-15 MED ORDER — QUETIAPINE FUMARATE 100 MG PO TABS: 100.0000 mg | ORAL_TABLET | Freq: Every day | ORAL | 2 refills | Status: DC

## 2023-09-15 NOTE — Progress Notes (Signed)
 Provider was able to speak with patient about medication refills. Patient was tearful during the discussion stating that she felt like she was being ignored and forgotten about. Patient states that she has gone so long without her medication that she needed her primary care to fill out her medication. Provider to refill patient's prescriptions to pharmacy of choice.

## 2023-09-15 NOTE — Telephone Encounter (Signed)
 Message acknowledge and reviewed. Provider was able to speak with patient over request for Quetiapine refill. Patient's medication to be e-prescribed to pharmacy of choice.

## 2023-09-16 ENCOUNTER — Telehealth (HOSPITAL_COMMUNITY): Payer: Self-pay | Admitting: *Deleted

## 2023-09-16 NOTE — Telephone Encounter (Signed)
 Received a PA request for her quetiapine. Previous note acknowleges her recent visit and her call for a PA to be done but not is it was done. I called her pharmacy on [address] and she picked it up in feb and they do not have a note it needs to be authorized.

## 2023-09-21 ENCOUNTER — Telehealth (HOSPITAL_COMMUNITY): Payer: Self-pay | Admitting: *Deleted

## 2023-09-21 ENCOUNTER — Ambulatory Visit (INDEPENDENT_AMBULATORY_CARE_PROVIDER_SITE_OTHER): Payer: MEDICAID | Admitting: Physician Assistant

## 2023-09-21 ENCOUNTER — Encounter (HOSPITAL_COMMUNITY): Payer: Self-pay | Admitting: Physician Assistant

## 2023-09-21 DIAGNOSIS — F4310 Post-traumatic stress disorder, unspecified: Secondary | ICD-10-CM

## 2023-09-21 DIAGNOSIS — F315 Bipolar disorder, current episode depressed, severe, with psychotic features: Secondary | ICD-10-CM | POA: Diagnosis not present

## 2023-09-21 DIAGNOSIS — G4700 Insomnia, unspecified: Secondary | ICD-10-CM

## 2023-09-21 MED ORDER — QUETIAPINE FUMARATE 100 MG PO TABS: 100.0000 mg | ORAL_TABLET | Freq: Every day | ORAL | 2 refills | Status: DC

## 2023-09-21 MED ORDER — TRAZODONE HCL 100 MG PO TABS: 100.0000 mg | ORAL_TABLET | Freq: Every day | ORAL | 2 refills | Status: DC

## 2023-09-21 MED ORDER — QUETIAPINE FUMARATE 400 MG PO TABS: 400.0000 mg | ORAL_TABLET | Freq: Every day | ORAL | 2 refills | Status: DC

## 2023-09-21 NOTE — Telephone Encounter (Signed)
 Fax received for PA of Quetiapine 400mg . Submitted online with cover my meds. Awaiting decision.
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
## 2023-09-21 NOTE — Telephone Encounter (Signed)
 Fax received for PA approval of Quetiapine Fumarate 400mg  until 09/20/24. Called to notify pharmacy.

## 2023-11-23 ENCOUNTER — Ambulatory Visit (INDEPENDENT_AMBULATORY_CARE_PROVIDER_SITE_OTHER): Payer: MEDICAID | Admitting: Physician Assistant

## 2023-11-23 ENCOUNTER — Encounter (HOSPITAL_COMMUNITY): Payer: Self-pay | Admitting: Physician Assistant

## 2023-11-23 VITALS — BP 157/114 | HR 79 | Temp 97.5°F | Ht 63.0 in | Wt 197.8 lb

## 2023-11-23 DIAGNOSIS — F315 Bipolar disorder, current episode depressed, severe, with psychotic features: Secondary | ICD-10-CM

## 2023-11-23 DIAGNOSIS — F4310 Post-traumatic stress disorder, unspecified: Secondary | ICD-10-CM | POA: Diagnosis not present

## 2023-11-23 DIAGNOSIS — G4700 Insomnia, unspecified: Secondary | ICD-10-CM | POA: Diagnosis not present

## 2023-11-23 DIAGNOSIS — Z79899 Other long term (current) drug therapy: Secondary | ICD-10-CM

## 2023-11-23 MED ORDER — TRAZODONE HCL 100 MG PO TABS: 100.0000 mg | ORAL_TABLET | Freq: Every day | ORAL | 2 refills | Status: DC

## 2023-11-23 MED ORDER — QUETIAPINE FUMARATE 400 MG PO TABS: 400.0000 mg | ORAL_TABLET | Freq: Every day | ORAL | 2 refills | Status: DC

## 2023-11-23 MED ORDER — QUETIAPINE FUMARATE 100 MG PO TABS: 100.0000 mg | ORAL_TABLET | Freq: Every day | ORAL | 2 refills | Status: DC

## 2023-11-23 NOTE — Progress Notes (Signed)
 BH MD/PA/NP OP Progress Note  11/23/2023 4:18 PM Akeira ANGEE FITTING  MRN:  [id number]  Chief Complaint:  Chief Complaint  Patient presents with   Follow-up   Medication Refill   HPI:   Amber Fitzpatrick is a 33 year old, African-American female with a past psychiatric history significant for bipolar disorder (with psychotic features), PTSD, insomnia, and panic disorder with agoraphobia who presents to [facility] for follow-up and medication management.  Patient is currently being managed on the following psychiatric medications:  Seroquel  100 mg daily Seroquel  400 mg at bedtime Trazodone  100 mg at bedtime  Patient presents to the encounter reporting no issues or concerns regarding her use of her current medications.  Patient denies experiencing any adverse side effects at this time.  Since taking her Seroquel , patient denies experiencing involuntary movements.  Patient denies experiencing manic symptoms.  She denies overt depressive symptoms but continues to endorse anxiety.  Patient rates her anxiety an 8 out of 10.  Patient reports being easily startled on occasion but denies any new stressors at this time.  A GAD-7 screen was performed with the patient scoring a 19.  Patient is alert and oriented x 4, calm, cooperative, and fully engaged in conversation during the encounter.  Patient describes her mood as fine.  Patient exhibits euthymic mood with appropriate affect.  Patient denies suicidal or homicidal ideations.  She further denies auditory or visual hallucinations and does not appear to be responding to internal/external stimuli.  Patient endorses good sleep and receives on average 8 hours of sleep per night.  Patient endorses fair appetite and eats on average 1 meal per day.  Patient denies alcohol consumption, tobacco use, or illicit drug use.  Visit Diagnosis:    ICD-10-CM   1. Insomnia, unspecified type  G47.00 traZODone  (DESYREL ) 100 MG  tablet    2. Bipolar disorder, current episode depressed, severe, with psychotic features (HCC)  F31.5 QUEtiapine  (SEROQUEL ) 100 MG tablet    QUEtiapine  (SEROQUEL ) 400 MG tablet    3. PTSD (post-traumatic stress disorder)  F43.10 QUEtiapine  (SEROQUEL ) 100 MG tablet      Past Psychiatric History:  Bipolar Disorder, Panic Disorder w/ Agoraphobia, PTSD, and Insomnia, and no history of Suicide Attempts, Self Injurious Behavior, or Psychiatric Hospitalizations.   Past Medical History:  Past Medical History:  Diagnosis Date   Abnormal Pap smear    f/u ok   Anemia    Anxiety    Asthma    Infection    Mental disorder    Trichimoniasis     Past Surgical History:  Procedure Laterality Date   INDUCED ABORTION     NO PAST SURGERIES      Family Psychiatric History:  Great Uncle (maternal) - Schizophrenia Great Aunt (maternal) - Schizophrenia   Family History:  Family History  Problem Relation Age of Onset   Hypertension Mother    Hyperlipidemia Mother    Hearing loss Father    Anesthesia problems Neg Hx    Other Neg Hx     Social History:  Social History   Socioeconomic History   Marital status: Single    Spouse name: Not on file   Number of children: Not on file   Years of education: Not on file   Highest education level: Not on file  Occupational History   Not on file  Tobacco Use   Smoking status: Every Day    Current packs/day: 0.50    Types: Cigarettes   Smokeless  tobacco: Never  Substance and Sexual Activity   Alcohol use: No   Drug use: Yes    Types: Marijuana    Comment: occ   Sexual activity: Yes    Birth control/protection: None  Other Topics Concern   Not on file  Social History Narrative   Not on file   Social Drivers of Health   Financial Resource Strain: Low Risk  (06/03/2022)   Overall Financial Resource Strain (CARDIA)    Difficulty of Paying Living Expenses: Not hard at all  Food Insecurity: Not on File (04/15/2023)   Received from  [facility]    Food: 0  Transportation Needs: No Transportation Needs (06/03/2022)   PRAPARE - Administrator, Civil Service (Medical): No    Lack of Transportation (Non-Medical): No  Physical Activity: Insufficiently Active (06/03/2022)   Exercise Vital Sign    Days of Exercise per Week: 1 day    Minutes of Exercise per Session: 20 min  Stress: Stress Concern Present (06/03/2022)   [employer] of Occupational Health - Occupational Stress Questionnaire    Feeling of Stress : Very much  Social Connections: Not on File (04/02/2023)   Received from [facility]   Social Connections    Connectedness: 0    Allergies:  Allergies  Allergen Reactions   Sulfa Antibiotics Rash    Metabolic Disorder Labs: No results found for: "HGBA1C", "MPG" No results found for: "PROLACTIN" No results found for: "CHOL", "TRIG", "HDL", "CHOLHDL", "VLDL", "LDLCALC" No results found for: "TSH"  Therapeutic Level Labs: No results found for: "LITHIUM" No results found for: "VALPROATE" No results found for: "CBMZ"  Current Medications: Current Outpatient Medications  Medication Sig Dispense Refill   albuterol  (PROVENTIL  HFA;VENTOLIN  HFA) 108 (90 Base) MCG/ACT inhaler Inhale 1-2 puffs into the lungs every 6 (six) hours as needed for wheezing or shortness of breath. 1 Inhaler 0   ALPRAZolam  (XANAX ) 0.5 MG tablet Take 1 tablet (0.5 mg total) by mouth 3 (three) times daily as needed. 42 tablet 0   HYDROcodone -acetaminophen  (NORCO/VICODIN) 5-325 MG tablet Take 1 tablet by mouth every 6 (six) hours as needed. 6 tablet 0   hydrOXYzine  (ATARAX ) 10 MG tablet Take 1 tablet (10 mg total) by mouth 3 (three) times daily as needed. 75 tablet 1   ibuprofen  (ADVIL ) 400 MG tablet Take 1 tablet (400 mg total) by mouth every 8 (eight) hours as needed for moderate pain. 21 tablet 0   QUEtiapine  (SEROQUEL ) 100 MG tablet Take 1 tablet (100 mg total) by mouth at bedtime. 30 tablet 2   QUEtiapine   (SEROQUEL ) 400 MG tablet Take 1 tablet (400 mg total) by mouth at bedtime. 30 tablet 2   traZODone  (DESYREL ) 100 MG tablet Take 1 tablet (100 mg total) by mouth daily. 30 tablet 2   No current facility-administered medications for this visit.     Musculoskeletal: Strength & Muscle Tone: within normal limits Gait & Station: normal Patient leans: N/A  Psychiatric Specialty Exam: Review of Systems  Psychiatric/Behavioral:  Negative for decreased concentration, dysphoric mood, hallucinations, self-injury, sleep disturbance and suicidal ideas. The patient is nervous/anxious. The patient is not hyperactive.     Blood pressure (!) 157/114, pulse 79, temperature (!) 97.5 F (36.4 C), temperature source Oral, height 5\' 3"  (1.6 m), weight 197 lb 12.8 oz (89.7 kg), SpO2 100%, unknown if currently breastfeeding.Body mass index is 35.04 kg/m.  General Appearance: Casual  Eye Contact:  Fair  Speech:  Clear and Coherent and  Normal Rate  Volume:  Normal  Mood:  Anxious  Affect:  Appropriate  Thought Process:  Coherent, Goal Directed, and Descriptions of Associations: Intact  Orientation:  Full (Time, Place, and Person)  Thought Content: WDL   Suicidal Thoughts:  No  Homicidal Thoughts:  No  Memory:  Immediate;   Good Recent;   Good Remote;   Good  Judgement:  Fair  Insight:  Fair  Psychomotor Activity:  Normal  Concentration:  Concentration: Good and Attention Span: Good  Recall:  Good  Fund of Knowledge: Good  Language: Good  Akathisia:  No  Handed:  Left  AIMS (if indicated): not done  Assets:  Communication Skills Desire for Improvement Housing Resilience Social Support  ADL's:  Intact  Cognition: WNL  Sleep:  Good   Screenings: AIMS    Flowsheet Row Clinical Support from 11/23/2023 in [facility]  AIMS Total Score 0      GAD-7    Flowsheet Row Clinical Support from 11/23/2023 in [facility] Clinical Support from  09/21/2023 in [facility] Clinical Support from 07/27/2023 in [facility] Clinical Support from 05/27/2023 in [facility] Clinical Support from 04/30/2023 in [facility]  Total GAD-7 Score 19 15 0 15 5      PHQ2-9    Flowsheet Row Clinical Support from 11/23/2023 in [facility] Clinical Support from 09/21/2023 in [facility] Clinical Support from 07/27/2023 in [facility] Clinical Support from 05/27/2023 in [facility] Clinical Support from 04/30/2023 in [facility]  PHQ-2 Total Score 0 0 0 0 0      Flowsheet Row Clinical Support from 11/23/2023 in [facility] Clinical Support from 09/21/2023 in [facility] Clinical Support from 07/27/2023 in [facility]  C-SSRS RISK CATEGORY No Risk No Risk No Risk        Assessment and Plan:   Amber Fitzpatrick is a 33 year old, African-American female with a past psychiatric history significant for bipolar disorder (with psychotic features), PTSD, insomnia, and panic disorder with agoraphobia who presents to [facility] for follow-up and medication management.  Patient presents to the encounter stating that she continues to take her medications as prescribed.  Patient denies experiencing any adverse side effects from her current medication regimen and denies the need for dosage adjustments at this time.  Patient further denies experiencing involuntary movements from the Seroquel .  An aims assessment was performed with the patient scoring a 0.  Patient denies experiencing manic symptoms at this time.  She further denies overt depressive symptoms but does continue to endorse some anxiety  accompanied by being easily startled.  A GAD-7 screen was performed with the patient scoring her 19.  Despite experiencing some anxiety, patient would like to continue taking her medications as prescribed.  Patient endorses stability on her current medication regimen.  Patient's medications to be e-prescribed to pharmacy of choice.  Due to her use of Seroquel , provider informed patient that the following labs would need to be obtained: Hemoglobin A1c, comprehensive metabolic panel, lipid profile, and complete blood count with differential.  Patient is currently being seen by primary care provider and states that she would obtain these labs at her primary care office.  Patient's blood pressure was obtained during the encounter.  Patient initial blood pressure reading was 151/111 mmHg.  A second reading was obtained with the patient having a blood pressure of 157/114 mmHg. Patient reports that she is currently being seen by her primary care provider and will address her elevated blood pressure.  Patient denies suicidal ideations and is able to contract for safety following the conclusion of the encounter.  Collaboration of Care: Collaboration of Care: Medication Management AEB provider managing patient's psychiatric medications, Psychiatrist AEB patient being followed by mental health provider at this facility, and Referral or follow-up with counselor/therapist AEB patient being seen by a licensed clinical social worker at this facility  Patient/Guardian was advised Release of Information must be obtained prior to any record release in order to collaborate their care with an outside provider. Patient/Guardian was advised if they have not already done so to contact the registration department to sign all necessary forms in order for us  to release information regarding their care.   Consent: Patient/Guardian gives verbal consent for treatment and assignment of benefits for services provided during this  visit. Patient/Guardian expressed understanding and agreed to proceed.   1. Insomnia, unspecified type  - traZODone  (DESYREL ) 100 MG tablet; Take 1 tablet (100 mg total) by mouth daily.  Dispense: 30 tablet; Refill: 2  2. Bipolar disorder, current episode depressed, severe, with psychotic features (HCC)  - QUEtiapine  (SEROQUEL ) 100 MG tablet; Take 1 tablet (100 mg total) by mouth at bedtime.  Dispense: 30 tablet; Refill: 2 - QUEtiapine  (SEROQUEL ) 400 MG tablet; Take 1 tablet (400 mg total) by mouth at bedtime.  Dispense: 30 tablet; Refill: 2  3. PTSD (post-traumatic stress disorder)  - QUEtiapine  (SEROQUEL ) 100 MG tablet; Take 1 tablet (100 mg total) by mouth at bedtime.  Dispense: 30 tablet; Refill: 2  4. Long term current use of antipsychotic medication (Primary) Patient to have the following labs obtained by her primary care provider: - Lipid profile - Complete blood count with differential - Hemoglobin A1c - Comprehensive metabolic panel  Patient to follow-up in 2 months Provider spent a total of 19 minutes with the patient/reviewing patient's chart  Gates Kasal, PA 11/23/2023, 4:18 PM

## 2024-01-19 ENCOUNTER — Ambulatory Visit (INDEPENDENT_AMBULATORY_CARE_PROVIDER_SITE_OTHER): Payer: MEDICAID | Admitting: Mental Health

## 2024-01-19 DIAGNOSIS — F315 Bipolar disorder, current episode depressed, severe, with psychotic features: Secondary | ICD-10-CM | POA: Diagnosis not present

## 2024-01-19 DIAGNOSIS — F4310 Post-traumatic stress disorder, unspecified: Secondary | ICD-10-CM

## 2024-01-19 NOTE — Progress Notes (Signed)
   THERAPIST PROGRESS NOTE  Session Time: 10:02 am ( 30 minutes)  Participation Level: Active  Behavioral Response: CasualAlertStable  Type of Therapy: Individual Therapy  Treatment Goals addressed:  STG: Amber Fitzpatrick will increase management of anxiety/stress AEB development of x 3 effective coping skills with ability to reframe maladaptive thinking patterns daily within the next 6 months   ProgressTowards Goals: Progressing  Interventions: Supportive  Summary:[name] is a 33 y.o. female who presents with dx of PTSD and bipolar disorder, current episode depressed. Presents alert and oriented; mood and affect adequate; stable. Speech clear and coherent. Thought process clear and coherent at increased rate; normal tone. Presents to in person session with daughter, noting lacking support with baby sitting but denies to have wanted to reschedule. Shares thoughts of engagement in relationship and shares frustrations; with decision to no longer engage. Notes working to manage stress and moods with engagement with healthy people and tries to remain postive and in the present. Shares hx of trauma and feels as if partner was triggering her to think of things in the past in which she denies to remember. Notes to be medication complaint and feels as if medications are effective. 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 Reports for moods to be stable and denies safety concerns.    Suicidal/Homicidal: Nowithout intent/plan  Therapist Response: Therapist engaged Addysin in therapy session. Educated on inappropriateness of daughter in session and encouraged virtual appointments when lacking sitting. Completed check in. Assessed for current level of functioning, sxs management and stressors. Engaged Yanis in exploring current stressors and ability to manage. Provided support and encouragement; validated feelings. Explored ability to engage in  appropriate effective decision making and quality of relationships and and supports. Explores medication compliance and balance in daily life. Reviewed session and provided folllow up.   Plan: Return again in  x 8 weeks.  Diagnosis: Bipolar disorder, current episode depressed, severe, with psychotic features (HCC)  PTSD (post-traumatic stress disorder)  Collaboration of Care: Other None  Patient/Guardian was advised Release of Information must be obtained prior to any record release in order to collaborate their care with an outside provider. Patient/Guardian was advised if they have not already done so to contact the registration department to sign all necessary forms in order for us  to release information regarding their care.   Consent: Patient/Guardian gives verbal consent for treatment and assignment of benefits for services provided during this visit. Patient/Guardian expressed understanding and agreed to proceed.   Ty Asal [place], [facility] 01/19/2024

## 2024-01-25 ENCOUNTER — Encounter (HOSPITAL_COMMUNITY): Payer: MEDICAID | Admitting: Physician Assistant

## 2024-01-25 ENCOUNTER — Telehealth (HOSPITAL_COMMUNITY): Payer: Self-pay

## 2024-01-25 NOTE — Telephone Encounter (Signed)
 PT came in 15 minutes after her appointment time - It was explained to the PT that due to the time she was trying to check in that the appointment would need to be rescheduled. The Pt stated  I have always been told that I am allowed 15 minutes , I informed her that was untrue that the appointment for an hour allows a 15 time frame but a 30 minute time frame is only a allotted - PT began going back and forth with front desk staff - the PT provider was then gotten so he could explain to the PT

## 2024-01-26 ENCOUNTER — Ambulatory Visit (INDEPENDENT_AMBULATORY_CARE_PROVIDER_SITE_OTHER): Payer: MEDICAID | Admitting: Physician Assistant

## 2024-01-26 VITALS — BP 145/93 | HR 95 | Temp 98.1°F | Ht 63.0 in | Wt 201.2 lb

## 2024-01-26 DIAGNOSIS — F4310 Post-traumatic stress disorder, unspecified: Secondary | ICD-10-CM | POA: Diagnosis not present

## 2024-01-26 DIAGNOSIS — F315 Bipolar disorder, current episode depressed, severe, with psychotic features: Secondary | ICD-10-CM | POA: Diagnosis not present

## 2024-01-26 DIAGNOSIS — G4700 Insomnia, unspecified: Secondary | ICD-10-CM | POA: Diagnosis not present

## 2024-01-26 DIAGNOSIS — Z79899 Other long term (current) drug therapy: Secondary | ICD-10-CM

## 2024-01-28 MED ORDER — TRAZODONE HCL 100 MG PO TABS: 100.0000 mg | ORAL_TABLET | Freq: Every day | ORAL | 2 refills | Status: DC

## 2024-01-28 MED ORDER — QUETIAPINE FUMARATE 100 MG PO TABS: 100.0000 mg | ORAL_TABLET | Freq: Every day | ORAL | 2 refills | Status: DC

## 2024-01-28 MED ORDER — TRAZODONE HCL 100 MG PO TABS
100.0000 mg | ORAL_TABLET | Freq: Every evening | ORAL | 2 refills | Status: DC | PRN
Start: 2024-01-28 — End: 2024-03-29

## 2024-01-28 MED ORDER — QUETIAPINE FUMARATE 400 MG PO TABS: 400.0000 mg | ORAL_TABLET | Freq: Every day | ORAL | 2 refills | Status: DC

## 2024-01-29 ENCOUNTER — Encounter (HOSPITAL_COMMUNITY): Payer: Self-pay | Admitting: Physician Assistant

## 2024-01-29 NOTE — Progress Notes (Signed)
 [facility] MD/PA/NP OP Progress Note  01/26/2024 3:30 PM Amber Fitzpatrick  MRN:  [id number]  Chief Complaint:  Chief Complaint  Patient presents with   Follow-up   Medication Refill   HPI:   Amber Fitzpatrick is a 33 year old, African-American female with a past psychiatric history significant for bipolar disorder (with psychotic features), PTSD, insomnia, and panic disorder with agoraphobia who presents to [facility] for follow-up and medication management.  Patient is currently being managed on the following psychiatric medications:  Seroquel  100 mg daily Seroquel  400 mg at bedtime Trazodone  100 mg at bedtime  Patient presents to the encounter stating that she has been doing well and continues to take her medications regularly.  Patient denies experiencing any adverse side effects associated with her medication use.  Patient reports that she continues to keep herself busy with work and taking care of her child.  She denies overt depressive symptoms but states that she continues to experience some anxiety when dealing/interacting with others.  When not dealing with individuals, patient reports that her anxiety is mostly manageable.  A PHQ-9 screen was performed with the patient scoring a 0.  A GAD-7 screen was also performed with the patient scoring a 12.  Patient is alert and oriented x 4, calm, cooperative, and fully engaged in conversation during the encounter.  Patient endorses good mood.  Patient exhibits euthymic mood with appropriate affect.  Patient denies suicidal or homicidal ideations.  She further denies auditory or visual hallucinations and does not appear to be responding to internal/external stimuli.  Patient endorses good sleep and receives on average 8 hours of sleep per night.  Patient endorses fair appetite and was on average 1 meal per day.  Patient denies alcohol consumption, tobacco use, or illicit drug use.  Visit Diagnosis:     ICD-10-CM   1. Long term current use of antipsychotic medication  Z79.899     2. Insomnia, unspecified type  G47.00 traZODone  (DESYREL ) 100 MG tablet    DISCONTINUED: traZODone  (DESYREL ) 100 MG tablet    3. Bipolar disorder, current episode depressed, severe, with psychotic features (HCC)  F31.5 QUEtiapine  (SEROQUEL ) 400 MG tablet    QUEtiapine  (SEROQUEL ) 100 MG tablet    DISCONTINUED: QUEtiapine  (SEROQUEL ) 100 MG tablet    4. PTSD (post-traumatic stress disorder)  F43.10 QUEtiapine  (SEROQUEL ) 100 MG tablet    DISCONTINUED: QUEtiapine  (SEROQUEL ) 100 MG tablet      Past Psychiatric History:  Bipolar Disorder, Panic Disorder w/ Agoraphobia, PTSD, and Insomnia, and no history of Suicide Attempts, Self Injurious Behavior, or Psychiatric Hospitalizations.   Past Medical History:  Past Medical History:  Diagnosis Date   Abnormal Pap smear    f/u ok   Anemia    Anxiety    Asthma    Infection    Mental disorder    Trichimoniasis     Past Surgical History:  Procedure Laterality Date   INDUCED ABORTION     NO PAST SURGERIES      Family Psychiatric History:  Great Uncle (maternal) - Schizophrenia Great Aunt (maternal) - Schizophrenia   Family History:  Family History  Problem Relation Age of Onset   Hypertension Mother    Hyperlipidemia Mother    Hearing loss Father    Anesthesia problems Neg Hx    Other Neg Hx     Social History:  Social History   Socioeconomic History   Marital status: Single    Spouse name: Not on file  Number of children: Not on file   Years of education: Not on file   Highest education level: Not on file  Occupational History   Not on file  Tobacco Use   Smoking status: Every Day    Current packs/day: 0.50    Types: Cigarettes   Smokeless tobacco: Never  Substance and Sexual Activity   Alcohol use: No   Drug use: Yes    Types: Marijuana    Comment: occ   Sexual activity: Yes    Birth control/protection: None  Other Topics Concern    Not on file  Social History Narrative   Not on file   Social Drivers of Health   Financial Resource Strain: Low Risk  (06/03/2022)   Overall Financial Resource Strain (CARDIA)    Difficulty of Paying Living Expenses: Not hard at all  Food Insecurity: Not on File (04/15/2023)   Received from [facility] In[facility]    Food: 0  Transportation Needs: No Transportation Needs (06/03/2022)   PRAPARE - Administrator, Civil Service (Medical): No    Lack of Transportation (Non-Medical): No  Physical Activity: Insufficiently Active (06/03/2022)   Exercise Vital Sign    Days of Exercise per Week: 1 day    Minutes of Exercise per Session: 20 min  Stress: Stress Concern Present (06/03/2022)   [employer] of Occupational Health - Occupational Stress Questionnaire    Feeling of Stress : Very much  Social Connections: Not on File (04/02/2023)   Received from [facility]   Social Connections    Connectedness: 0    Allergies:  Allergies  Allergen Reactions   Sulfa Antibiotics Rash    Metabolic Disorder Labs: No results found for: HGBA1C, MPG No results found for: PROLACTIN No results found for: CHOL, TRIG, HDL, CHOLHDL, VLDL, LDLCALC No results found for: TSH  Therapeutic Level Labs: No results found for: LITHIUM No results found for: VALPROATE No results found for: CBMZ  Current Medications: Current Outpatient Medications  Medication Sig Dispense Refill   albuterol  (PROVENTIL  HFA;VENTOLIN  HFA) 108 (90 Base) MCG/ACT inhaler Inhale 1-2 puffs into the lungs every 6 (six) hours as needed for wheezing or shortness of breath. 1 Inhaler 0   ALPRAZolam  (XANAX ) 0.5 MG tablet Take 1 tablet (0.5 mg total) by mouth 3 (three) times daily as needed. 42 tablet 0   HYDROcodone -acetaminophen  (NORCO/VICODIN) 5-325 MG tablet Take 1 tablet by mouth every 6 (six) hours as needed. 6 tablet 0   hydrOXYzine  (ATARAX ) 10 MG tablet Take 1 tablet (10 mg total) by mouth 3  (three) times daily as needed. 75 tablet 1   ibuprofen  (ADVIL ) 400 MG tablet Take 1 tablet (400 mg total) by mouth every 8 (eight) hours as needed for moderate pain. 21 tablet 0   QUEtiapine  (SEROQUEL ) 100 MG tablet Take 1 tablet (100 mg total) by mouth daily. 30 tablet 2   QUEtiapine  (SEROQUEL ) 400 MG tablet Take 1 tablet (400 mg total) by mouth at bedtime. 30 tablet 2   traZODone  (DESYREL ) 100 MG tablet Take 1 tablet (100 mg total) by mouth at bedtime as needed for sleep. 30 tablet 2   No current facility-administered medications for this visit.     Musculoskeletal: Strength & Muscle Tone: within normal limits Gait & Station: normal Patient leans: N/A  Psychiatric Specialty Exam: Review of Systems  Psychiatric/Behavioral:  Negative for decreased concentration, dysphoric mood, hallucinations, self-injury, sleep disturbance and suicidal ideas. The patient is nervous/anxious. The patient is not hyperactive.  Blood pressure (!) 145/93, pulse 95, temperature 98.1 F (36.7 C), temperature source Oral, height 5' 3 (1.6 m), weight 201 lb 3.2 oz (91.3 kg), SpO2 97%, unknown if currently breastfeeding.Body mass index is 35.64 kg/m.  General Appearance: Casual  Eye Contact:  Fair  Speech:  Clear and Coherent and Normal Rate  Volume:  Normal  Mood:  Anxious  Affect:  Appropriate  Thought Process:  Coherent, Goal Directed, and Descriptions of Associations: Intact  Orientation:  Full (Time, Place, and Person)  Thought Content: WDL   Suicidal Thoughts:  No  Homicidal Thoughts:  No  Memory:  Immediate;   Good Recent;   Good Remote;   Good  Judgement:  Fair  Insight:  Fair  Psychomotor Activity:  Normal  Concentration:  Concentration: Good and Attention Span: Good  Recall:  Good  Fund of Knowledge: Good  Language: Good  Akathisia:  No  Handed:  Left  AIMS (if indicated): not done  Assets:  Communication Skills Desire for Improvement Housing Resilience Social Support  ADL's:   Intact  Cognition: WNL  Sleep:  Good   Screenings: AIMS    Flowsheet Row Clinical Support from 01/26/2024 in [facility] Clinical Support from 11/23/2023 in [facility]  AIMS Total Score 0 0   GAD-7    Flowsheet Row Clinical Support from 01/26/2024 in [facility] Clinical Support from 11/23/2023 in [facility] Clinical Support from 09/21/2023 in [facility] Clinical Support from 07/27/2023 in [facility] Clinical Support from 05/27/2023 in [facility]  Total GAD-7 Score 12 19 15  0 15   PHQ2-9    Flowsheet Row Clinical Support from 01/26/2024 in [facility] Clinical Support from 11/23/2023 in [facility] Clinical Support from 09/21/2023 in [facility] Clinical Support from 07/27/2023 in [facility] Clinical Support from 05/27/2023 in [facility]  PHQ-2 Total Score 0 0 0 0 0   Flowsheet Row Clinical Support from 01/26/2024 in [facility] Clinical Support from 11/23/2023 in [facility] Clinical Support from 09/21/2023 in [facility]  C-SSRS RISK CATEGORY No Risk No Risk No Risk     Assessment and Plan:   Amber Fitzpatrick is a 34 year old, African-American female with a past psychiatric history significant for bipolar disorder (with psychotic features), PTSD, insomnia, and panic disorder with agoraphobia who presents to [facility] for follow-up and medication management.  Patient presents to the encounter stating that she has been taking her medications regularly and denies experiencing any adverse side effects.  An aims assessment was performed with  the patient scoring a 0.  Patient denies overt depressive symptoms but states that she still occasionally experiences anxiety when interacting with others.  When not having to interact with others, patient reports that her anxiety is fairly manageable.  A PHQ-9 screen was performed with the patient scoring a 0.  A GAD-7 screen was also performed with the patient scoring a 12.  Patient endorses stability on her current medication regimen and would like to continue taking her medications as prescribed.  Patient's medications to be e-prescribed to pharmacy of choice.  A [place] Suicide Severity Rating Scale was performed with the patient being considered moderate risk.  Patient denies suicidal ideations and is able to  contract for safety following the conclusion of the encounter.  Collaboration of Care: Collaboration of Care: Medication Management AEB provider managing patient's psychiatric medications, Psychiatrist AEB patient being followed by mental health provider at this facility, and Referral or follow-up with counselor/therapist AEB patient being seen by a licensed clinical social worker at this facility  Patient/Guardian was advised Release of Information must be obtained prior to any record release in order to collaborate their care with an outside provider. Patient/Guardian was advised if they have not already done so to contact the registration department to sign all necessary forms in order for us  to release information regarding their care.   Consent: Patient/Guardian gives verbal consent for treatment and assignment of benefits for services provided during this visit. Patient/Guardian expressed understanding and agreed to proceed.   1. Insomnia, unspecified type  - traZODone  (DESYREL ) 100 MG tablet; Take 1 tablet (100 mg total) by mouth at bedtime as needed for sleep.  Dispense: 30 tablet; Refill: 2  2. Bipolar disorder, current episode depressed, severe, with psychotic features  (HCC)  - QUEtiapine  (SEROQUEL ) 400 MG tablet; Take 1 tablet (400 mg total) by mouth at bedtime.  Dispense: 30 tablet; Refill: 2 - QUEtiapine  (SEROQUEL ) 100 MG tablet; Take 1 tablet (100 mg total) by mouth daily.  Dispense: 30 tablet; Refill: 2  3. PTSD (post-traumatic stress disorder)  - QUEtiapine  (SEROQUEL ) 100 MG tablet; Take 1 tablet (100 mg total) by mouth daily.  Dispense: 30 tablet; Refill: 2  4. Long term current use of antipsychotic medication (Primary) Pending labs  Patient to obtain an up-to-date EKG from her primary care office  Patient to follow-up in 2 months Provider spent a total of 19 minutes with the patient/reviewing patient's chart  Reginia FORBES Bolster, PA 01/26/2024, 3:30 PM

## 2024-03-21 ENCOUNTER — Ambulatory Visit (INDEPENDENT_AMBULATORY_CARE_PROVIDER_SITE_OTHER): Payer: MEDICAID | Admitting: Mental Health

## 2024-03-21 DIAGNOSIS — F315 Bipolar disorder, current episode depressed, severe, with psychotic features: Secondary | ICD-10-CM

## 2024-03-21 DIAGNOSIS — F4310 Post-traumatic stress disorder, unspecified: Secondary | ICD-10-CM

## 2024-03-21 NOTE — Progress Notes (Signed)
 THERAPIST PROGRESS NOTE Virtual Visit via Video Note  I connected with Amber Fitzpatrick on 03/21/24 at 10:00 AM EDT by a video enabled telemedicine application and verified that I am speaking with the correct person using two identifiers.  Location: Patient: home address on file Provider: office   I discussed the limitations of evaluation and management by telemedicine and the availability of in person appointments. The patient expressed understanding and agreed to proceed.  I discussed the assessment and treatment plan with the patient. The patient was provided an opportunity to ask questions and all were answered. The patient agreed with the plan and demonstrated an understanding of the instructions.   The patient was advised to call back or seek an in-person evaluation if the symptoms worsen or if the condition fails to improve as anticipated.  I provided 4 minutes of non-face-to-face time during this encounter.   Ty Bernice Savant, [facility]   Session Time: 10:03 am ( 43 minutes)  Participation Level: Active  Behavioral Response: CasualAlertWNL  Type of Therapy: Individual Therapy  Treatment Goals addressed:  STG: Amber Fitzpatrick will increase management of anxiety/stress AEB development of x 3 effective coping skills with ability to reframe maladaptive thinking patterns daily within the next 6 months   ProgressTowards Goals: Progressing  Interventions: Supportive  Summary: Amber Fitzpatrick is a 33 y.o. female who presents with dx of PTSD and bipolar disorder. Presents alert and oriented; mood and affect adequate; stable. Speech clear and coherent. Thought process clear and coherent normal rate; normal tone. Shares for moods to have been stable and denies elevated moods or concerns for depression. Shares with therapist presenting to an event for sister's birthday in which anxiety was increased. Notes thoughts of being overwhelmed with high degree of individuals. Notes hx of  becoming overwhelmed and vomiting due to anxiety. Shares with therapist ability to cope with going outside for air at times and using of deep breathing techniques. Shares thoughts on relationship and ways in which anxiety can interfere noting difficulty with text messaging. Notes to state to partner drop dead and able to process with therapist use of that statement and able to identify alternative statement to indicate desire to have space from partner at that time. Shares sleep and appetite to be WNL. Ongoing work towards goals. Able to utilize x 3 coping skills for stress and anxiety with ongoing work to reframe distorted thoughts. No safety concerns.    Suicidal/Homicidal: Nowithout intent/plan  Therapist Response: Therapist engaged [name] in therapy session. Assessed for confidentiality of virtual session and location. Engaged [name] in exploring current level of functioning, sxs management and level of stressors. Supported in processing current stability in sxs factors that contribute. Supported in processing attendance to birthday party and presence of anxiety and use of coping skills. Explores and discussed ways in which anxiety effects areas of functioning. Reviewed use of coping skills and ability to communicate with others effectively. Encouraged ongoing medication compliance and identification of distorted thoughts that contribute to anxiousness. Reviewed session and provided follow up.   Plan: Return again in  x 6 weeks.  Diagnosis: Bipolar disorder, current episode depressed, severe, with psychotic features (HCC)  PTSD (post-traumatic stress disorder)  Collaboration of Care: Other None  Patient/Guardian was advised Release of Information must be obtained prior to any record release in order to collaborate their care with an outside provider. Patient/Guardian was advised if they have not already done so to contact the registration department to sign all necessary forms in order  for us   to release information regarding their care.   Consent: Patient/Guardian gives verbal consent for treatment and assignment of benefits for services provided during this visit. Patient/Guardian expressed understanding and agreed to proceed.   Ty Asal [place], [facility] 03/21/2024

## 2024-03-21 NOTE — Progress Notes (Signed)
 SABRA

## 2024-03-29 ENCOUNTER — Ambulatory Visit (HOSPITAL_COMMUNITY): Payer: MEDICAID | Admitting: Physician Assistant

## 2024-03-29 ENCOUNTER — Encounter (HOSPITAL_COMMUNITY): Payer: Self-pay | Admitting: Physician Assistant

## 2024-03-29 ENCOUNTER — Ambulatory Visit (HOSPITAL_COMMUNITY): Payer: MEDICAID | Admitting: Mental Health

## 2024-03-29 VITALS — BP 173/125 | HR 76 | Temp 97.7°F | Ht 63.0 in | Wt 203.6 lb

## 2024-03-29 DIAGNOSIS — F4310 Post-traumatic stress disorder, unspecified: Secondary | ICD-10-CM

## 2024-03-29 DIAGNOSIS — G4700 Insomnia, unspecified: Secondary | ICD-10-CM

## 2024-03-29 DIAGNOSIS — Z79899 Other long term (current) drug therapy: Secondary | ICD-10-CM

## 2024-03-29 DIAGNOSIS — F315 Bipolar disorder, current episode depressed, severe, with psychotic features: Secondary | ICD-10-CM

## 2024-03-29 MED ORDER — TRAZODONE HCL 100 MG PO TABS: 100.0000 mg | ORAL_TABLET | Freq: Every evening | ORAL | 2 refills | Status: DC | PRN

## 2024-03-29 MED ORDER — QUETIAPINE FUMARATE 400 MG PO TABS: 400.0000 mg | ORAL_TABLET | Freq: Every day | ORAL | 2 refills | Status: DC

## 2024-03-29 MED ORDER — QUETIAPINE FUMARATE 100 MG PO TABS: 100.0000 mg | ORAL_TABLET | Freq: Every day | ORAL | 2 refills | Status: DC

## 2024-03-29 NOTE — Progress Notes (Signed)
 [facility] MD/PA/NP OP Progress Note  03/29/2024 10:30 AM Amber Fitzpatrick  MRN:  [id number]  Chief Complaint:  Chief Complaint  Patient presents with   Follow-up   Medication Refill   HPI:   Amber Fitzpatrick is a 33 year old, African-American female with a past psychiatric history significant for bipolar disorder (with psychotic features), PTSD, insomnia, and panic disorder with agoraphobia who presents to [facility] for follow-up and medication management.  Patient is currently being managed on the following psychiatric medications:  Seroquel  100 mg daily Seroquel  400 mg at bedtime Trazodone  100 mg at bedtime  Patient presents to the encounter stating that she continues to take her medications regularly and denies experiencing any adverse side effects.  Patient endorses being irritable due to her dating life.  Patient denies overt depressive symptoms but continues to endorse elevated anxiety.  Patient rates her anxiety a 10 out of 10.  Patient's anxiety is attributed to recently starting dating and it has not been going well.  A PHQ-9 screen was performed with the patient scoring a 0.  A GAD-7 screen was also performed with the patient scoring a 14.  Patient is alert and oriented x 4, calm, cooperative, and fully engaged in conversation during the encounter.  Patient endorses happy mood.  Patient exhibits euthymic mood with appropriate affect.  Patient denies suicidal or homicidal ideations.  She further denies auditory or visual hallucinations and does not appear to be responding to internal/external stimuli.  Patient endorses good sleep and receives on average 8 hours of sleep per night.  Patient endorses decreased appetite and eats on average 1 meal per day.  Patient denies alcohol consumption, tobacco use, or illicit drug use.  Visit Diagnosis:    ICD-10-CM   1. Long term current use of antipsychotic medication  Z79.899     2. Insomnia,  unspecified type  G47.00 traZODone  (DESYREL ) 100 MG tablet    3. Bipolar disorder, current episode depressed, severe, with psychotic features (HCC)  F31.5 QUEtiapine  (SEROQUEL ) 100 MG tablet    QUEtiapine  (SEROQUEL ) 400 MG tablet    4. PTSD (post-traumatic stress disorder)  F43.10 QUEtiapine  (SEROQUEL ) 100 MG tablet      Past Psychiatric History:  Bipolar Disorder, Panic Disorder w/ Agoraphobia, PTSD, and Insomnia, and no history of Suicide Attempts, Self Injurious Behavior, or Psychiatric Hospitalizations.   Past Medical History:  Past Medical History:  Diagnosis Date   Abnormal Pap smear    f/u ok   Anemia    Anxiety    Asthma    Infection    Mental disorder    Trichimoniasis     Past Surgical History:  Procedure Laterality Date   INDUCED ABORTION     NO PAST SURGERIES      Family Psychiatric History:  Great Uncle (maternal) - Schizophrenia Great Aunt (maternal) - Schizophrenia   Family History:  Family History  Problem Relation Age of Onset   Hypertension Mother    Hyperlipidemia Mother    Hearing loss Father    Anesthesia problems Neg Hx    Other Neg Hx     Social History:  Social History   Socioeconomic History   Marital status: Single    Spouse name: Not on file   Number of children: Not on file   Years of education: Not on file   Highest education level: Not on file  Occupational History   Not on file  Tobacco Use   Smoking status: Every Day  Current packs/day: 0.50    Types: Cigarettes   Smokeless tobacco: Never  Substance and Sexual Activity   Alcohol use: No   Drug use: Yes    Types: Marijuana    Comment: occ   Sexual activity: Yes    Birth control/protection: None  Other Topics Concern   Not on file  Social History Narrative   Not on file   Social Drivers of Health   Financial Resource Strain: Low Risk  (06/03/2022)   Overall Financial Resource Strain (CARDIA)    Difficulty of Paying Living Expenses: Not hard at all  Food  Insecurity: Not on File (04/15/2023)   Received from [facility] Insecurity    Food: 0  Transportation Needs: No Transportation Needs (06/03/2022)   PRAPARE - Administrator, Civil Service (Medical): No    Lack of Transportation (Non-Medical): No  Physical Activity: Insufficiently Active (06/03/2022)   Exercise Vital Sign    Days of Exercise per Week: 1 day    Minutes of Exercise per Session: 20 min  Stress: Stress Concern Present (06/03/2022)   [employer] of Occupational Health - Occupational Stress Questionnaire    Feeling of Stress : Very much  Social Connections: Not on File (04/02/2023)   Received from [facility]   Social Connections    Connectedness: 0    Allergies:  Allergies  Allergen Reactions   Sulfa Antibiotics Rash    Metabolic Disorder Labs: No results found for: HGBA1C, MPG No results found for: PROLACTIN No results found for: CHOL, TRIG, HDL, CHOLHDL, VLDL, LDLCALC No results found for: TSH  Therapeutic Level Labs: No results found for: LITHIUM No results found for: VALPROATE No results found for: CBMZ  Current Medications: Current Outpatient Medications  Medication Sig Dispense Refill   albuterol  (PROVENTIL  HFA;VENTOLIN  HFA) 108 (90 Base) MCG/ACT inhaler Inhale 1-2 puffs into the lungs every 6 (six) hours as needed for wheezing or shortness of breath. 1 Inhaler 0   ALPRAZolam  (XANAX ) 0.5 MG tablet Take 1 tablet (0.5 mg total) by mouth 3 (three) times daily as needed. 42 tablet 0   HYDROcodone -acetaminophen  (NORCO/VICODIN) 5-325 MG tablet Take 1 tablet by mouth every 6 (six) hours as needed. 6 tablet 0   hydrOXYzine  (ATARAX ) 10 MG tablet Take 1 tablet (10 mg total) by mouth 3 (three) times daily as needed. 75 tablet 1   ibuprofen  (ADVIL ) 400 MG tablet Take 1 tablet (400 mg total) by mouth every 8 (eight) hours as needed for moderate pain. 21 tablet 0   QUEtiapine  (SEROQUEL ) 100 MG tablet Take 1 tablet (100 mg total) by  mouth daily. 30 tablet 2   QUEtiapine  (SEROQUEL ) 400 MG tablet Take 1 tablet (400 mg total) by mouth at bedtime. 30 tablet 2   traZODone  (DESYREL ) 100 MG tablet Take 1 tablet (100 mg total) by mouth at bedtime as needed for sleep. 30 tablet 2   No current facility-administered medications for this visit.     Musculoskeletal: Strength & Muscle Tone: within normal limits Gait & Station: normal Patient leans: N/A  Psychiatric Specialty Exam: Review of Systems  Psychiatric/Behavioral:  Negative for decreased concentration, dysphoric mood, hallucinations, self-injury, sleep disturbance and suicidal ideas. The patient is nervous/anxious. The patient is not hyperactive.     Blood pressure (!) 173/125, pulse 76, temperature 97.7 F (36.5 C), temperature source Oral, height 5' 3 (1.6 m), weight 203 lb 9.6 oz (92.4 kg), SpO2 97%, unknown if currently breastfeeding.Body mass index is 36.07 kg/m.  General  Appearance: Casual  Eye Contact:  Fair  Speech:  Clear and Coherent and Normal Rate  Volume:  Normal  Mood:  Anxious  Affect:  Appropriate  Thought Process:  Coherent, Goal Directed, and Descriptions of Associations: Intact  Orientation:  Full (Time, Place, and Person)  Thought Content: WDL   Suicidal Thoughts:  No  Homicidal Thoughts:  No  Memory:  Immediate;   Good Recent;   Good Remote;   Good  Judgement:  Fair  Insight:  Fair  Psychomotor Activity:  Normal  Concentration:  Concentration: Good and Attention Span: Good  Recall:  Good  Fund of Knowledge: Good  Language: Good  Akathisia:  No  Handed:  Left  AIMS (if indicated): not done  Assets:  Communication Skills Desire for Improvement Housing Resilience Social Support  ADL's:  Intact  Cognition: WNL  Sleep:  Good   Screenings: AIMS    Flowsheet Row Clinical Support from 03/29/2024 in [facility] Clinical Support from 01/26/2024 in [facility] Clinical Support  from 11/23/2023 in [facility]  AIMS Total Score 0 0 0   GAD-7    Flowsheet Row Clinical Support from 03/29/2024 in [facility] Clinical Support from 01/26/2024 in [facility] Clinical Support from 11/23/2023 in [facility] Clinical Support from 09/21/2023 in [facility] Clinical Support from 07/27/2023 in [facility]  Total GAD-7 Score 14 12 19 15  0   PHQ2-9    Flowsheet Row Clinical Support from 03/29/2024 in [facility] Clinical Support from 01/26/2024 in [facility] Clinical Support from 11/23/2023 in [facility] Clinical Support from 09/21/2023 in [facility] Clinical Support from 07/27/2023 in [facility]  PHQ-2 Total Score 0 0 0 0 0   Flowsheet Row Clinical Support from 03/29/2024 in [facility] Clinical Support from 01/26/2024 in [facility] Clinical Support from 11/23/2023 in [facility]  C-SSRS RISK CATEGORY No Risk No Risk No Risk     Assessment and Plan:   Amber Fitzpatrick is a 33 year old, African-American female with a past psychiatric history significant for bipolar disorder (with psychotic features), PTSD, insomnia, and panic disorder with agoraphobia who presents to [facility] for follow-up and medication management.  Patient presents to the encounter stating that she continues to take her medications regularly and denies experiencing any adverse side effects.  An aims assessment was performed with the patient scoring a 0.  Patient denies overt depressive symptoms but continues to endorse elevated anxiety she attributes to recently dating.  A PHQ-9 screen was  performed with the patient scoring a 0.  GAD-7 screen was also performed with the patient scoring a 14.  Patient endorses stability on her current medication regimen and would like to continue taking her medications as prescribed.  Patient informed provider that she would get her blood work done at her primary care office.  Patient she also obtained her EKG at her primary care office.  A [place] Suicide Severity Rating Scale was performed with the patient being considered no risk.  Patient denies suicidal ideations and is able to contract for safety following the conclusion of the encounter.    Collaboration of Care: Collaboration of Care: Medication Management AEB provider managing patient's psychiatric medications, Primary  Care Provider AEB patient being seen by primary care provider, Psychiatrist AEB patient being followed by mental health provider at this facility, and Referral or follow-up with counselor/therapist AEB patient being seen by a licensed clinical social worker at this facility  Patient/Guardian was advised Release of Information must be obtained prior to any record release in order to collaborate their care with an outside provider. Patient/Guardian was advised if they have not already done so to contact the registration department to sign all necessary forms in order for us  to release information regarding their care.   Consent: Patient/Guardian gives verbal consent for treatment and assignment of benefits for services provided during this visit. Patient/Guardian expressed understanding and agreed to proceed.   1. Insomnia, unspecified type  - traZODone  (DESYREL ) 100 MG tablet; Take 1 tablet (100 mg total) by mouth at bedtime as needed for sleep.  Dispense: 30 tablet; Refill: 2  2. Bipolar disorder, current episode depressed, severe, with psychotic features (HCC)  - QUEtiapine  (SEROQUEL ) 100 MG tablet; Take 1 tablet (100 mg total) by mouth daily.  Dispense: 30 tablet; Refill:  2 - QUEtiapine  (SEROQUEL ) 400 MG tablet; Take 1 tablet (400 mg total) by mouth at bedtime.  Dispense: 30 tablet; Refill: 2  3. PTSD (post-traumatic stress disorder)  - QUEtiapine  (SEROQUEL ) 100 MG tablet; Take 1 tablet (100 mg total) by mouth daily.  Dispense: 30 tablet; Refill: 2  4. Long term current use of antipsychotic medication (Primary) Pending labs, patient informed provider that she would have her labs drawn at her primary care provider (hemoglobin A1c, lipid profile, comprehensive metabolic panel, complete blood count with differential, and thyroid panel). Patient informed provider that her primary care provider would obtain her EKG  Patient to follow-up in 2 months Provider spent a total of 21 minutes with the patient/reviewing patient's chart  Reginia FORBES Bolster, PA 03/29/2024, 10:30 AM

## 2024-05-04 ENCOUNTER — Ambulatory Visit (HOSPITAL_COMMUNITY): Payer: MEDICAID | Admitting: Mental Health

## 2024-05-31 ENCOUNTER — Ambulatory Visit (HOSPITAL_COMMUNITY): Payer: MEDICAID | Admitting: Physician Assistant

## 2024-05-31 VITALS — BP 180/130 | HR 87 | Ht 63.0 in | Wt 200.4 lb

## 2024-05-31 DIAGNOSIS — F315 Bipolar disorder, current episode depressed, severe, with psychotic features: Secondary | ICD-10-CM | POA: Diagnosis not present

## 2024-05-31 DIAGNOSIS — F4310 Post-traumatic stress disorder, unspecified: Secondary | ICD-10-CM | POA: Diagnosis not present

## 2024-05-31 DIAGNOSIS — Z79899 Other long term (current) drug therapy: Secondary | ICD-10-CM | POA: Diagnosis not present

## 2024-05-31 DIAGNOSIS — G4700 Insomnia, unspecified: Secondary | ICD-10-CM

## 2024-06-04 ENCOUNTER — Encounter (HOSPITAL_COMMUNITY): Payer: Self-pay | Admitting: Physician Assistant

## 2024-06-04 MED ORDER — QUETIAPINE FUMARATE 100 MG PO TABS: 100.0000 mg | ORAL_TABLET | Freq: Every day | ORAL | 2 refills | Status: DC

## 2024-06-04 MED ORDER — TRAZODONE HCL 100 MG PO TABS: 100.0000 mg | ORAL_TABLET | Freq: Every evening | ORAL | 2 refills | Status: DC | PRN

## 2024-06-04 MED ORDER — QUETIAPINE FUMARATE 400 MG PO TABS: 400.0000 mg | ORAL_TABLET | Freq: Every day | ORAL | 2 refills | Status: DC

## 2024-06-04 NOTE — Progress Notes (Cosign Needed)
 BH MD/PA/NP OP Progress Note  05/31/2024 10:30 AM Amber Fitzpatrick  MRN:  [id number]  Chief Complaint:  Chief Complaint  Patient presents with   Follow-up   Medication Refill   HPI:   Amber Fitzpatrick is a 33 year old, African-American female with a past psychiatric history significant for bipolar disorder (with psychotic features), PTSD, insomnia, and panic disorder with agoraphobia who presents to [facility] for follow-up and medication management.  Patient is currently being managed on the following psychiatric medications:  Seroquel  100 mg daily Seroquel  400 mg at bedtime Trazodone  100 mg at bedtime  Patient presents to the encounter reporting no issues or concerns regarding her current medication regimen.  She continues to take her medications regularly and denies experiencing any adverse side effects.  Patient endorses depression due to lack of routine and normalcy.  She reports that she has been getting irritable towards certain members in her life such as her mother.  She reports that her mother has been using her mental health against her.  She also reports that people that she knows have been purposely trying to agitate her.  Patient reports that her anxiety has been manageable.  Patient does endorse stressors related to her daughter's teachers from school.  A PHQ 2 screen was performed with the patient scoring a 0.  A GAD-7 screen was also performed with the patient scoring a 0.  Patient is alert and oriented x 4, calm, cooperative, and fully engaged in conversation during the encounter.  Patient endorses good mood.  Patient exhibits euthymic mood with appropriate affect.  Patient denies suicidal or homicidal ideations.  She further denies auditory or visual hallucinations and does not appear to be responding to internal/external stimuli.  Patient endorses good sleep and receives on average 8 hours of sleep per night.  Patient endorses  decreased appetite and eats on average 1 meal per day.  Patient denies alcohol consumption, tobacco use, or illicit drug use.  Visit Diagnosis:    ICD-10-CM   1. Bipolar disorder, current episode depressed, severe, with psychotic features (HCC)  F31.5 QUEtiapine  (SEROQUEL ) 100 MG tablet    QUEtiapine  (SEROQUEL ) 400 MG tablet    2. PTSD (post-traumatic stress disorder)  F43.10 QUEtiapine  (SEROQUEL ) 100 MG tablet    3. Insomnia, unspecified type  G47.00 traZODone  (DESYREL ) 100 MG tablet      Past Psychiatric History:  Bipolar Disorder, Panic Disorder w/ Agoraphobia, PTSD, and Insomnia, and no history of Suicide Attempts, Self Injurious Behavior, or Psychiatric Hospitalizations.   Past Medical History:  Past Medical History:  Diagnosis Date   Abnormal Pap smear    f/u ok   Anemia    Anxiety    Asthma    Infection    Mental disorder    Trichimoniasis     Past Surgical History:  Procedure Laterality Date   INDUCED ABORTION     NO PAST SURGERIES      Family Psychiatric History:  Great Uncle (maternal) - Schizophrenia Great Aunt (maternal) - Schizophrenia   Family History:  Family History  Problem Relation Age of Onset   Hypertension Mother    Hyperlipidemia Mother    Hearing loss Father    Anesthesia problems Neg Hx    Other Neg Hx     Social History:  Social History   Socioeconomic History   Marital status: Single    Spouse name: Not on file   Number of children: Not on file   Years of education:  Not on file   Highest education level: Not on file  Occupational History   Not on file  Tobacco Use   Smoking status: Every Day    Current packs/day: 0.50    Types: Cigarettes   Smokeless tobacco: Never  Substance and Sexual Activity   Alcohol use: No   Drug use: Yes    Types: Marijuana    Comment: occ   Sexual activity: Yes    Birth control/protection: None  Other Topics Concern   Not on file  Social History Narrative   Not on file   Social Drivers of  Health   Financial Resource Strain: Low Risk  (06/03/2022)   Overall Financial Resource Strain (CARDIA)    Difficulty of Paying Living Expenses: Not hard at all  Food Insecurity: Not on File (04/15/2023)   Received from [facility] Insecurity    Food: 0  Transportation Needs: No Transportation Needs (06/03/2022)   PRAPARE - Administrator, Civil Service (Medical): No    Lack of Transportation (Non-Medical): No  Physical Activity: Insufficiently Active (06/03/2022)   Exercise Vital Sign    Days of Exercise per Week: 1 day    Minutes of Exercise per Session: 20 min  Stress: Stress Concern Present (06/03/2022)   [employer] of Occupational [employer] - Occupational Stress Questionnaire    Feeling of Stress : Very much  Social Connections: Not on File (04/02/2023)   Received from [facility]   Social Connections    Connectedness: 0    Allergies:  Allergies  Allergen Reactions   Sulfa Antibiotics Rash    Metabolic Disorder Labs: No results found for: HGBA1C, MPG No results found for: PROLACTIN No results found for: CHOL, TRIG, HDL, CHOLHDL, VLDL, LDLCALC No results found for: TSH  Therapeutic Level Labs: No results found for: LITHIUM No results found for: VALPROATE No results found for: CBMZ  Current Medications: Current Outpatient Medications  Medication Sig Dispense Refill   albuterol  (PROVENTIL  HFA;VENTOLIN  HFA) 108 (90 Base) MCG/ACT inhaler Inhale 1-2 puffs into the lungs every 6 (six) hours as needed for wheezing or shortness of breath. 1 Inhaler 0   ALPRAZolam  (XANAX ) 0.5 MG tablet Take 1 tablet (0.5 mg total) by mouth 3 (three) times daily as needed. 42 tablet 0   HYDROcodone -acetaminophen  (NORCO/VICODIN) 5-325 MG tablet Take 1 tablet by mouth every 6 (six) hours as needed. 6 tablet 0   hydrOXYzine  (ATARAX ) 10 MG tablet Take 1 tablet (10 mg total) by mouth 3 (three) times daily as needed. 75 tablet 1   ibuprofen  (ADVIL ) 400 MG tablet  Take 1 tablet (400 mg total) by mouth every 8 (eight) hours as needed for moderate pain. 21 tablet 0   QUEtiapine  (SEROQUEL ) 100 MG tablet Take 1 tablet (100 mg total) by mouth daily. 30 tablet 2   QUEtiapine  (SEROQUEL ) 400 MG tablet Take 1 tablet (400 mg total) by mouth at bedtime. 30 tablet 2   traZODone  (DESYREL ) 100 MG tablet Take 1 tablet (100 mg total) by mouth at bedtime as needed for sleep. 30 tablet 2   No current facility-administered medications for this visit.     Musculoskeletal: Strength & Muscle Tone: within normal limits Gait & Station: normal Patient leans: N/A  Psychiatric Specialty Exam: Review of Systems  Psychiatric/Behavioral:  Negative for decreased concentration, dysphoric mood, hallucinations, self-injury, sleep disturbance and suicidal ideas. The patient is nervous/anxious. The patient is not hyperactive.     Blood pressure (!) 180/130, pulse 87, height 5'  3 (1.6 m), weight 200 lb 6.4 oz (90.9 kg), SpO2 97%, unknown if currently breastfeeding.Body mass index is 35.5 kg/m.  General Appearance: Casual  Eye Contact:  Fair  Speech:  Clear and Coherent and Normal Rate  Volume:  Normal  Mood:  Anxious  Affect:  Appropriate  Thought Process:  Coherent, Goal Directed, and Descriptions of Associations: Intact  Orientation:  Full (Time, Place, and Person)  Thought Content: WDL   Suicidal Thoughts:  No  Homicidal Thoughts:  No  Memory:  Immediate;   Good Recent;   Good Remote;   Good  Judgement:  Fair  Insight:  Fair  Psychomotor Activity:  Normal  Concentration:  Concentration: Good and Attention Span: Good  Recall:  Good  Fund of Knowledge: Good  Language: Good  Akathisia:  No  Handed:  Left  AIMS (if indicated): not done  Assets:  Communication Skills Desire for Improvement Housing Resilience Social Support  ADL's:  Intact  Cognition: WNL  Sleep:  Good   Screenings: AIMS    Flowsheet Row Clinical Support from 05/31/2024 in [facility] Clinical Support from 03/29/2024 in [facility] Clinical Support from 01/26/2024 in [facility] Clinical Support from 11/23/2023 in [facility]  AIMS Total Score 0 0 0 0   GAD-7    Flowsheet Row Clinical Support from 05/31/2024 in [facility] Clinical Support from 03/29/2024 in [facility] Clinical Support from 01/26/2024 in [facility] Clinical Support from 11/23/2023 in [facility] Clinical Support from 09/21/2023 in [facility]  Total GAD-7 Score 10 14 12 19 15    PHQ2-9    Flowsheet Row Clinical Support from 05/31/2024 in [facility] Clinical Support from 03/29/2024 in [facility] Clinical Support from 01/26/2024 in [facility] Clinical Support from 11/23/2023 in [facility] Clinical Support from 09/21/2023 in [facility]  PHQ-2 Total Score 0 0 0 0 0   Flowsheet Row Clinical Support from 05/31/2024 in [facility] Clinical Support from 03/29/2024 in [facility] Clinical Support from 01/26/2024 in [facility]  C-SSRS RISK CATEGORY No Risk No Risk No Risk     Assessment and Plan:   Amber Fitzpatrick is a 33 year old, African-American female with a past psychiatric history significant for bipolar disorder (with psychotic features), PTSD, insomnia, and panic disorder with agoraphobia who presents to [facility] for follow-up and medication management.  Patient presents to the encounter stating that she has been taking her medications regularly and denies experiencing any adverse side effects.  An  aims assessment was performed with the patient scoring a 0.  Patient endorses depression attributed to having a lack of routine and normalcy in her life.  She reports that her mother has been using her mental health against her and states that people that she knows how have purposely been trying to agitate her.  She reports that her anxiety has been manageable but does endorse stressors related to her daughter's teachers from school. A PHQ 2 screen was performed with the patient scoring a 0.  A GAD-7 screen was also performed with the patient scoring a 0.  Despite her symptoms of depression, patient appears to be in good spirits.  She endorses stability  through her current medication regimen and would like to continue taking her medications as prescribed.  Patient's medications to be e-prescribed to pharmacy of choice.  Provider informed patient that the following labs will need to be obtained due to her use of Seroquel : Hemoglobin A1c, comprehensive metabolic panel, lipid profile, and complete blood count with differential.  Patient was also informed that an EKG would need to be obtained.  Patient reports that she will get the following labs and EKG performed by her OB/GYN.  A [place] Suicide Severity Rating Scale was performed with the patient being considered no risk.  Patient denies suicidal ideations and is able to contract for safety following the conclusion of the encounter.    Collaboration of Care: Collaboration of Care: Medication Management AEB provider managing patient's psychiatric medications, Primary Care Provider AEB patient being seen by primary care provider, Psychiatrist AEB patient being followed by mental health provider at this facility, and Referral or follow-up with counselor/therapist AEB patient being seen by a licensed clinical social worker at this facility  Patient/Guardian was advised Release of Information must be obtained prior to any record release in order to  collaborate their care with an outside provider. Patient/Guardian was advised if they have not already done so to contact the registration department to sign all necessary forms in order for us  to release information regarding their care.   Consent: Patient/Guardian gives verbal consent for treatment and assignment of benefits for services provided during this visit. Patient/Guardian expressed understanding and agreed to proceed.   1. Bipolar disorder, current episode depressed, severe, with psychotic features (HCC)  - QUEtiapine  (SEROQUEL ) 100 MG tablet; Take 1 tablet (100 mg total) by mouth daily.  Dispense: 30 tablet; Refill: 2 - QUEtiapine  (SEROQUEL ) 400 MG tablet; Take 1 tablet (400 mg total) by mouth at bedtime.  Dispense: 30 tablet; Refill: 2  2. PTSD (post-traumatic stress disorder)  - QUEtiapine  (SEROQUEL ) 100 MG tablet; Take 1 tablet (100 mg total) by mouth daily.  Dispense: 30 tablet; Refill: 2  3. Insomnia, unspecified type  - traZODone  (DESYREL ) 100 MG tablet; Take 1 tablet (100 mg total) by mouth at bedtime as needed for sleep.  Dispense: 30 tablet; Refill: 2  4. Long term current use of antipsychotic medication (Primary) Pending labs, patient informed provider that she would have her labs drawn at her primary care provider (hemoglobin A1c, lipid profile, comprehensive metabolic panel, complete blood count with differential, and thyroid panel). Patient informed provider that her primary care provider would obtain her EKG  Patient to follow-up in 6 weeks Provider spent a total of 21 minutes with the patient/reviewing patient's chart  Amber FORBES Bolster, PA 05/31/2024, 10:30 AM

## 2024-06-27 NOTE — Progress Notes (Unsigned)
 [facility]   Telephone:(336) [phone] Fax:(336) [phone]   Clinic New consult Note   Patient Care Team: Medicine, [facility] as PCP - General (Family Medicine) 06/27/2024  CHIEF COMPLAINTS/PURPOSE OF CONSULTATION:  Iron deficiency anemia   HISTORY OF PRESENTING ILLNESS:  Amber Fitzpatrick 33 y.o. female is here because of iron deficiency anemia.  She has history consistent with hypertension and seasonal allergies.  She also has significant anxiety.  She was referred by her primary care provider.  Most recent labs were done on 06/21/2024.  Hemoglobin is slightly low at 10.9 with normal hematocrit at 34.2.  Her MCV was low at 78, MCH and MCH 24.9.  Platelet count was normal 387.  Serum iron was normal at 38 however iron saturation was very low at 8%.  Serum ferritin was 12 with TIBC of 474.  Previous check labs on 05/29/2021 showed normal Hgb and HCT at 12.8 and 38.9 respectively.  She was started on oral iron daily by her primary care on 1230.  She was found to have abnormal CBC from *** ***She denies recent chest pain on exertion, shortness of breath on minimal exertion, pre-syncopal episodes, or palpitations. ***She had not noticed any recent bleeding such as epistaxis, hematuria or hematochezia ***The patient denies over the counter NSAID ingestion. She is not *** on antiplatelets agents. Her last colonoscopy was *** ***She had no prior history or diagnosis of cancer. Her age appropriate screening programs are up-to-date. ***She denies any pica and eats a variety of diet. ***She never donated blood or received blood transfusion ***The patient was prescribed oral iron supplements and she takes ***   REVIEW OF SYSTEMS:   Constitutional: Denies fevers, chills or abnormal night sweats Eyes: Denies blurriness of vision, double vision or watery eyes Ears, nose, mouth, throat, and face: Denies mucositis or sore throat Respiratory: Denies cough, dyspnea or  wheezes Cardiovascular: Denies palpitation, chest discomfort or lower extremity swelling Gastrointestinal:  Denies nausea, heartburn or change in bowel habits Skin: Denies abnormal skin rashes Lymphatics: Denies new lymphadenopathy or easy bruising Neurological:Denies numbness, tingling or new weaknesses Behavioral/Psych: Mood is stable, no new changes   All other systems were reviewed with the patient and are negative.   MEDICAL HISTORY:  Past Medical History:  Diagnosis Date   Abnormal Pap smear    f/u ok   Anemia    Anxiety    Asthma    Infection    Mental disorder    Trichimoniasis     SURGICAL HISTORY: Past Surgical History:  Procedure Laterality Date   INDUCED ABORTION     NO PAST SURGERIES      SOCIAL HISTORY: Social History   Socioeconomic History   Marital status: Single    Spouse name: Not on file   Number of children: Not on file   Years of education: Not on file   Highest education level: Not on file  Occupational History   Not on file  Tobacco Use   Smoking status: Every Day    Current packs/day: 0.50    Types: Cigarettes   Smokeless tobacco: Never  Substance and Sexual Activity   Alcohol use: No   Drug use: Yes    Types: Marijuana    Comment: occ   Sexual activity: Yes    Birth control/protection: None  Other Topics Concern   Not on file  Social History Narrative   Not on file   Social Drivers of Health   Financial Resource Strain:  Low Risk  (06/03/2022)   Overall Financial Resource Strain (CARDIA)    Difficulty of Paying Living Expenses: Not hard at all  Food Insecurity: Not on File (04/15/2023)   Received from [facility]    Food: 0  Transportation Needs: No Transportation Needs (06/03/2022)   PRAPARE - Administrator, Civil Service (Medical): No    Lack of Transportation (Non-Medical): No  Physical Activity: Insufficiently Active (06/03/2022)   Exercise Vital Sign    Days of Exercise per Week: 1 day     Minutes of Exercise per Session: 20 min  Stress: Stress Concern Present (06/03/2022)   [employer] of Occupational Health - Occupational Stress Questionnaire    Feeling of Stress : Very much  Social Connections: Not on File (04/02/2023)   Received from [facility]   Social Connections    Connectedness: 0  Intimate Partner Violence: Not At Risk (06/03/2022)   Humiliation, Afraid, Rape, and Kick questionnaire    Fear of Current or Ex-Partner: No    Emotionally Abused: No    Physically Abused: No    Sexually Abused: No    FAMILY HISTORY: Family History  Problem Relation Age of Onset   Hypertension Mother    Hyperlipidemia Mother    Hearing loss Father    Anesthesia problems Neg Hx    Other Neg Hx     ALLERGIES:  is allergic to sulfa antibiotics.  MEDICATIONS:  Current Outpatient Medications  Medication Sig Dispense Refill   albuterol  (PROVENTIL  HFA;VENTOLIN  HFA) 108 (90 Base) MCG/ACT inhaler Inhale 1-2 puffs into the lungs every 6 (six) hours as needed for wheezing or shortness of breath. 1 Inhaler 0   ALPRAZolam  (XANAX ) 0.5 MG tablet Take 1 tablet (0.5 mg total) by mouth 3 (three) times daily as needed. 42 tablet 0   HYDROcodone -acetaminophen  (NORCO/VICODIN) 5-325 MG tablet Take 1 tablet by mouth every 6 (six) hours as needed. 6 tablet 0   hydrOXYzine  (ATARAX ) 10 MG tablet Take 1 tablet (10 mg total) by mouth 3 (three) times daily as needed. 75 tablet 1   ibuprofen  (ADVIL ) 400 MG tablet Take 1 tablet (400 mg total) by mouth every 8 (eight) hours as needed for moderate pain. 21 tablet 0   QUEtiapine  (SEROQUEL ) 100 MG tablet Take 1 tablet (100 mg total) by mouth daily. 30 tablet 2   QUEtiapine  (SEROQUEL ) 400 MG tablet Take 1 tablet (400 mg total) by mouth at bedtime. 30 tablet 2   traZODone  (DESYREL ) 100 MG tablet Take 1 tablet (100 mg total) by mouth at bedtime as needed for sleep. 30 tablet 2   No current facility-administered medications for this visit.    PHYSICAL  EXAMINATION: ECOG PERFORMANCE STATUS: {CHL ONC ECOG PS:[id number]}  There were no vitals filed for this visit. There were no vitals filed for this visit.  GENERAL:alert, no distress and comfortable SKIN: skin color, texture, turgor are normal, no rashes or significant lesions EYES: normal, conjunctiva are pink and non-injected, sclera clear OROPHARYNX:no exudate, no erythema and lips, buccal mucosa, and tongue normal  NECK: supple, thyroid normal size, non-tender, without nodularity LYMPH:  no palpable lymphadenopathy in the cervical, axillary or inguinal LUNGS: clear to auscultation and percussion with normal breathing effort HEART: regular rate & rhythm and no murmurs and no lower extremity edema ABDOMEN:abdomen soft, non-tender and normal bowel sounds Musculoskeletal:no cyanosis of digits and no clubbing  PSYCH: alert & oriented x 3 with fluent speech NEURO: no focal motor/sensory deficits  LABORATORY DATA:  I have reviewed the data as listed    Latest Ref Rng & Units 09/06/2017   11:40 AM 04/03/2017   11:59 AM 12/11/2015    9:20 AM  CBC  WBC 4.0 - 10.5 K/uL 9.0  9.0  9.5   Hemoglobin 12.0 - 15.0 g/dL 89.2  88.5  88.7   Hematocrit 36.0 - 46.0 % 34.4  34.9  33.2   Platelets 150 - 400 K/uL 394  344  337        Latest Ref Rng & Units 09/06/2017   11:40 AM 04/03/2017   11:59 AM 12/11/2015    9:20 AM  CMP  Glucose 65 - 99 mg/dL 94  92  90   BUN 6 - 20 mg/dL 6  11  5    Creatinine 0.44 - 1.00 mg/dL 9.28  9.12  9.38   Sodium 135 - 145 mmol/L 138  136  134   Potassium 3.5 - 5.1 mmol/L 2.9  3.6  3.2   Chloride 101 - 111 mmol/L 103  104  104   CO2 22 - 32 mmol/L 23  23  24    Calcium 8.9 - 10.3 mg/dL 9.0  9.7  9.4   Total Protein 6.5 - 8.1 g/dL 8.0  8.3    Total Bilirubin 0.3 - 1.2 mg/dL 0.9  0.6    Alkaline Phos 38 - 126 U/L 50  46    AST 15 - 41 U/L 26  25    ALT 14 - 54 U/L 21  17       RADIOGRAPHIC STUDIES: I have personally reviewed the radiological images as listed and  agreed with the findings in the report. No results found.  No orders of the defined types were placed in this encounter.   All questions were answered. The patient knows to call the clinic with any problems, questions or concerns. The total time spent in the appointment was {CHL ONC TIME VISIT - DTPQU:[id number]}.     Powell FORBES Lessen, NP 06/27/2024 5:07 PM  I, Izetta Neither, am acting as scribe for Onita Mattock, MD.   {Add scribe attestation statement}

## 2024-06-28 ENCOUNTER — Inpatient Hospital Stay: Payer: MEDICAID | Attending: Nurse Practitioner | Admitting: Nurse Practitioner

## 2024-06-28 ENCOUNTER — Inpatient Hospital Stay: Payer: MEDICAID

## 2024-06-28 VITALS — BP 138/80 | HR 89 | Temp 97.8°F | Resp 17 | Wt 200.4 lb

## 2024-06-28 DIAGNOSIS — F17210 Nicotine dependence, cigarettes, uncomplicated: Secondary | ICD-10-CM | POA: Insufficient documentation

## 2024-06-28 DIAGNOSIS — D509 Iron deficiency anemia, unspecified: Secondary | ICD-10-CM | POA: Diagnosis not present

## 2024-06-28 DIAGNOSIS — F419 Anxiety disorder, unspecified: Secondary | ICD-10-CM | POA: Insufficient documentation

## 2024-06-28 DIAGNOSIS — I10 Essential (primary) hypertension: Secondary | ICD-10-CM | POA: Insufficient documentation

## 2024-06-28 NOTE — Assessment & Plan Note (Signed)
 This is a 33 y.o. female is here because of iron deficiency anemia.  She has history consistent with hypertension and seasonal allergies.  She also has significant anxiety.  She was referred by her primary care provider.  Most recent labs were done on 06/21/2024.  Hemoglobin is slightly low at 10.9 with normal hematocrit at 34.2.  Her MCV was low at 78, MCH and MCH 24.9.  Platelet count was normal 387.  Serum iron was normal at 38 however iron saturation was very low at 8%.  Serum ferritin was 12 with TIBC of 474.  She was started on oral iron daily by her primary care on 1230. Previous check labs on 05/29/2021 showed normal Hgb and HCT at 12.8 and 38.9 respectively.  We discussed most common causes of iron deficiency anemia and young, physically healthy women, is acute blood loss from heavy menstrual cycles.  She has had more significant anemia approximately 10 years ago.  She was started on oral iron which helped significantly.  She has had several normal blood counts in the past few years.  Recommend she start taking oral iron every day.  Encouraged her to take this with orange juice or vitamin C to help with absorption.  We discussed common side effects of oral iron including nausea, abdominal pain, and constipation.  She may also noticed a black color to her stool.  Her labs were drawn on 06/21/2024 by primary care.  Will draw labs at 32-month follow-up.  Will include full anemia panel along with nutritional causes of iron deficiency anemia such as B12 and folate.  The patient is agreeable to this plan.

## 2024-07-12 ENCOUNTER — Encounter (HOSPITAL_COMMUNITY): Payer: MEDICAID | Admitting: Physician Assistant

## 2024-08-02 ENCOUNTER — Encounter (HOSPITAL_COMMUNITY): Payer: Self-pay | Admitting: Physician Assistant

## 2024-08-02 ENCOUNTER — Ambulatory Visit (HOSPITAL_COMMUNITY): Payer: MEDICAID | Admitting: Physician Assistant

## 2024-08-02 VITALS — BP 152/114 | HR 85 | Temp 98.1°F | Ht 63.0 in | Wt 201.2 lb

## 2024-08-02 DIAGNOSIS — G4700 Insomnia, unspecified: Secondary | ICD-10-CM | POA: Diagnosis not present

## 2024-08-02 DIAGNOSIS — F4310 Post-traumatic stress disorder, unspecified: Secondary | ICD-10-CM

## 2024-08-02 DIAGNOSIS — F315 Bipolar disorder, current episode depressed, severe, with psychotic features: Secondary | ICD-10-CM

## 2024-08-02 DIAGNOSIS — F4001 Agoraphobia with panic disorder: Secondary | ICD-10-CM

## 2024-08-02 DIAGNOSIS — Z79899 Other long term (current) drug therapy: Secondary | ICD-10-CM

## 2024-08-02 MED ORDER — QUETIAPINE FUMARATE 400 MG PO TABS: 400.0000 mg | ORAL_TABLET | Freq: Every day | ORAL | 2 refills | Status: AC

## 2024-08-02 MED ORDER — TRAZODONE HCL 100 MG PO TABS: 100.0000 mg | ORAL_TABLET | Freq: Every evening | ORAL | 2 refills | Status: AC | PRN

## 2024-08-02 MED ORDER — QUETIAPINE FUMARATE 100 MG PO TABS: 100.0000 mg | ORAL_TABLET | Freq: Every day | ORAL | 2 refills | Status: AC

## 2024-08-02 NOTE — Progress Notes (Signed)
 [facility] MD/PA/NP OP Progress Note  08/02/2024 10:00 AM Amber Fitzpatrick  MRN:  [id number]  Chief Complaint:  Chief Complaint  Patient presents with   Follow-up   Medication Refill   HPI:   Amber Fitzpatrick is a 34 year old, African-American female with a past psychiatric history significant for bipolar disorder (with psychotic features), PTSD, insomnia, and panic disorder with agoraphobia who presents to [facility] for follow-up and medication management.  Patient is currently being managed on the following psychiatric medications:  Seroquel  100 mg daily Seroquel  400 mg at bedtime Trazodone  100 mg at bedtime  Patient presents to the encounter stating that she has been taking her medications regularly and denies experiencing any adverse side effects.  She reports that she was able to obtain her labs recently and was placed on losartan for her high blood pressure.  Patient also states that she is taking iron pills for the treatment of her anemia.  Lastly, patient reports that she is on pravastatin for her cholesterol.  Patient denies overt depressive symptoms at this time.  She does endorse anxiety and rates her anxiety a 6 out of 10.  She reports that her anxiety is attributed to the current dating scene.  A PHQ-2 screen was performed with the patient scoring a 0.  A GAD-7 screen was also performed with depression scoring a 0.  Patient is alert and oriented x 4, calm, cooperative, and fully engaged in conversation during the encounter.  Patient endorses okay mood.  Patient exhibits euthymic mood with appropriate affect.  Patient denies suicidal or homicidal ideations.  She further denies auditory or visual hallucinations and does not appear to be responding to internal/external stimuli.  Patient endorses good sleep and receives on average 8 hours of sleep per night.  Patient endorses good appetite and eats on average 3 meals per day.  Patient denies  alcohol consumption, tobacco use, or illicit drug use.  Visit Diagnosis:    ICD-10-CM   1. Bipolar disorder, current episode depressed, severe, with psychotic features (HCC)  F31.5 QUEtiapine  (SEROQUEL ) 100 MG tablet    QUEtiapine  (SEROQUEL ) 400 MG tablet    2. PTSD (post-traumatic stress disorder)  F43.10 QUEtiapine  (SEROQUEL ) 100 MG tablet    3. Insomnia, unspecified type  G47.00 traZODone  (DESYREL ) 100 MG tablet      Past Psychiatric History:  Bipolar Disorder, Panic Disorder w/ Agoraphobia, PTSD, and Insomnia, and no history of Suicide Attempts, Self Injurious Behavior, or Psychiatric Hospitalizations.   Past Medical History:  Past Medical History:  Diagnosis Date   Abnormal Pap smear    f/u ok   Anemia    Anxiety    Asthma    Infection    Mental disorder    Trichimoniasis     Past Surgical History:  Procedure Laterality Date   INDUCED ABORTION     NO PAST SURGERIES      Family Psychiatric History:  Great Uncle (maternal) - Schizophrenia Great Aunt (maternal) - Schizophrenia   Family History:  Family History  Problem Relation Age of Onset   Hypertension Mother    Hyperlipidemia Mother    Hearing loss Father    Anesthesia problems Neg Hx    Other Neg Hx     Social History:  Social History   Socioeconomic History   Marital status: Single    Spouse name: Not on file   Number of children: Not on file   Years of education: Not on file   Highest  education level: Not on file  Occupational History   Not on file  Tobacco Use   Smoking status: Every Day    Current packs/day: 0.50    Types: Cigarettes   Smokeless tobacco: Never  Substance and Sexual Activity   Alcohol use: No   Drug use: Yes    Types: Marijuana    Comment: occ   Sexual activity: Yes    Birth control/protection: None  Other Topics Concern   Not on file  Social History Narrative   Not on file   Social Drivers of Health   Tobacco Use: High Risk (08/02/2024)   Patient History     Smoking Tobacco Use: Every Day    Smokeless Tobacco Use: Never    Passive Exposure: Not on file  Financial Resource Strain: Low Risk (06/03/2022)   Overall Financial Resource Strain (CARDIA)    Difficulty of Paying Living Expenses: Not hard at all  Food Insecurity: No Food Insecurity (06/28/2024)   Epic    Worried About Programme Researcher, Broadcasting/film/video in the Last Year: Never true    Ran Out of Food in the Last Year: Never true  Transportation Needs: No Transportation Needs (06/28/2024)   Epic    Lack of Transportation (Medical): No    Lack of Transportation (Non-Medical): No  Physical Activity: Insufficiently Active (06/03/2022)   Exercise Vital Sign    Days of Exercise per Week: 1 day    Minutes of Exercise per Session: 20 min  Stress: Stress Concern Present (06/03/2022)   [employer] - Occupational Stress Questionnaire    Feeling of Stress : Very much  Social Connections: Not on File (04/02/2023)   Received from [facility]   Social Connections    Connectedness: 0  Depression (PHQ2-9): Low Risk (08/02/2024)   Depression (PHQ2-9)    PHQ-2 Score: 0  Alcohol Screen: Low Risk (06/03/2022)   Alcohol Screen    Last Alcohol Screening Score (AUDIT): 0  Housing: Low Risk (06/28/2024)   Epic    Unable to Pay for Housing in the Last Year: No    Number of Times Moved in the Last Year: 0    Homeless in the Last Year: No  Utilities: Not At Risk (06/28/2024)   Epic    Threatened with loss of utilities: No  Health Literacy: Not on file    Allergies:  Allergies  Allergen Reactions   Sulfa Antibiotics Rash    Metabolic Disorder Labs: No results found for: HGBA1C, MPG No results found for: PROLACTIN No results found for: CHOL, TRIG, HDL, CHOLHDL, VLDL, LDLCALC No results found for: TSH  Therapeutic Level Labs: No results found for: LITHIUM No results found for: VALPROATE No results found for: CBMZ  Current Medications: Current Outpatient  Medications  Medication Sig Dispense Refill   albuterol  (PROVENTIL  HFA;VENTOLIN  HFA) 108 (90 Base) MCG/ACT inhaler Inhale 1-2 puffs into the lungs every 6 (six) hours as needed for wheezing or shortness of breath. 1 Inhaler 0   HYDROcodone -acetaminophen  (NORCO/VICODIN) 5-325 MG tablet Take 1 tablet by mouth every 6 (six) hours as needed. 6 tablet 0   hydrOXYzine  (ATARAX ) 10 MG tablet Take 1 tablet (10 mg total) by mouth 3 (three) times daily as needed. 75 tablet 1   ibuprofen  (ADVIL ) 400 MG tablet Take 1 tablet (400 mg total) by mouth every 8 (eight) hours as needed for moderate pain. 21 tablet 0   QUEtiapine  (SEROQUEL ) 100 MG tablet Take 1 tablet (100 mg total) by mouth daily. 30  tablet 2   QUEtiapine  (SEROQUEL ) 400 MG tablet Take 1 tablet (400 mg total) by mouth at bedtime. 30 tablet 2   traZODone  (DESYREL ) 100 MG tablet Take 1 tablet (100 mg total) by mouth at bedtime as needed for sleep. 30 tablet 2   No current facility-administered medications for this visit.     Musculoskeletal: Strength & Muscle Tone: within normal limits Gait & Station: normal Patient leans: N/A  Psychiatric Specialty Exam: Review of Systems  Psychiatric/Behavioral:  Negative for decreased concentration, dysphoric mood, hallucinations, self-injury, sleep disturbance and suicidal ideas. The patient is nervous/anxious. The patient is not hyperactive.     Blood pressure (!) 152/114, pulse 85, temperature 98.1 F (36.7 C), temperature source Oral, height 5' 3 (1.6 m), weight 201 lb 3.2 oz (91.3 kg), SpO2 100%, unknown if currently breastfeeding.Body mass index is 35.64 kg/m.  General Appearance: Casual  Eye Contact:  Fair  Speech:  Clear and Coherent and Normal Rate  Volume:  Normal  Mood:  Anxious  Affect:  Appropriate  Thought Process:  Coherent, Goal Directed, and Descriptions of Associations: Intact  Orientation:  Full (Time, Place, and Person)  Thought Content: WDL   Suicidal Thoughts:  No  Homicidal  Thoughts:  No  Memory:  Immediate;   Good Recent;   Good Remote;   Good  Judgement:  Fair  Insight:  Fair  Psychomotor Activity:  Normal  Concentration:  Concentration: Good and Attention Span: Good  Recall:  Good  Fund of Knowledge: Good  Language: Good  Akathisia:  No  Handed:  Left  AIMS (if indicated): not done  Assets:  Communication Skills Desire for Improvement Housing Resilience Social Support  ADL's:  Intact  Cognition: WNL  Sleep:  Good   Screenings: AIMS    Flowsheet Row Clinical Support from 08/02/2024 in [facility] Clinical Support from 05/31/2024 in [facility] Clinical Support from 03/29/2024 in [facility] Clinical Support from 01/26/2024 in [facility] Clinical Support from 11/23/2023 in [facility]  AIMS Total Score 0 0 0 0 0   GAD-7    Flowsheet Row Clinical Support from 08/02/2024 in [facility] Clinical Support from 05/31/2024 in [facility] Clinical Support from 03/29/2024 in [facility] Clinical Support from 01/26/2024 in [facility] Clinical Support from 11/23/2023 in [facility]  Total GAD-7 Score 0 10 14 12 19    PHQ2-9    Flowsheet Row Clinical Support from 08/02/2024 in [facility] Office Visit from 06/28/2024 in [facility] Clinical Support from 05/31/2024 in [facility] Clinical Support from 03/29/2024 in [facility] Clinical Support from 01/26/2024 in [facility]  PHQ-2 Total Score 0 0 0 0 0   Flowsheet Row Clinical Support from 08/02/2024 in [facility] Clinical Support from  05/31/2024 in [facility] Clinical Support from 03/29/2024 in [facility]  C-SSRS RISK CATEGORY No Risk No Risk No Risk     Assessment and Plan:   Amber Fitzpatrick is a 34 year old, African-American female with a past psychiatric history significant for bipolar disorder (with psychotic features), PTSD, insomnia, and panic disorder with agoraphobia who presents to [facility] for follow-up and medication management.  Patient presents to the encounter stating that she has been taking her medications regularly and denies experiencing any adverse side effects.  An aims assessment was performed with patient scoring of 0.  Patient appears stable on her medications.  She does not appear to be exhibiting any overt depressive symptoms nor does she endorse anxiety.  A PHQ 2 screening was performed with the patient scoring a 0.  A GAD-7 screen was also performed the patient scored a 0. Patient does not appear to be exhibiting any changes in her behavior.  Patient denies paranoia and does not appear to be responding to internal/external stimuli.  Patient endorses stability on her current medication regimen and would like to continue taking her medications as prescribed.  Patient's medications to be e-prescribed to pharmacy of choice.  Patient to be scheduled for an EKG.  The following labs were obtained from the patient: Hemoglobin A1c, comprehensive metabolic panel, complete blood count with differential, lipid panel.  - Hemoglobin A1c (performed on 06/21/2024) - hemoglobin A1c within normal limits - Comprehensive metabolic panel (performed on 06/21/2024) - comprehensive metabolic panel significant for decreased carbon dioxide (19 mmol/L) - Complete blood count with differential (performed on 06/21/2024) -complete blood count with differential significant for elevated white blood cell count (12.2 x 10 E3/uL),  decreased hemoglobin (10.9 g/dL), decreased MCV (78 fL), decreased MCH (24.9 pg), increased RDW (19.2%), elevated neutrophil count (7.9 x 10 E3/uL), and elevated lymphocyte count (3.6 x 10 E3/uL) - Lipid panel (performed on 06/21/2024) - lipid panel significant for elevated total cholesterol (271 mg/dL) and elevated LDL cholesterol (184 mg/dL).  A [place] Suicide Severity Rating Scale was performed with the patient being considered no risk.  Patient denies suicidal ideations and is able to contract for safety following the conclusion of the encounter.    Collaboration of Care: Collaboration of Care: Medication Management AEB provider managing patient's psychiatric medications, Primary Care Provider AEB patient being seen by primary care provider, Psychiatrist AEB patient being followed by mental health provider at this facility, and Referral or follow-up with counselor/therapist AEB patient being seen by a licensed clinical social worker at this facility  Patient/Guardian was advised Release of Information must be obtained prior to any record release in order to collaborate their care with an outside provider. Patient/Guardian was advised if they have not already done so to contact the registration department to sign all necessary forms in order for us  to release information regarding their care.   Consent: Patient/Guardian gives verbal consent for treatment and assignment of benefits for services provided during this visit. Patient/Guardian expressed understanding and agreed to proceed.   1. Bipolar disorder, current episode depressed, severe, with psychotic features (HCC)  - QUEtiapine  (SEROQUEL ) 100 MG tablet; Take 1 tablet (100 mg total) by mouth daily.  Dispense: 30 tablet; Refill: 2 - QUEtiapine  (SEROQUEL ) 400 MG tablet; Take 1 tablet (400 mg total) by mouth at bedtime.  Dispense: 30 tablet; Refill: 2  2. PTSD (post-traumatic stress disorder)  - QUEtiapine  (SEROQUEL ) 100 MG tablet; Take 1  tablet (100 mg total) by mouth daily.  Dispense: 30 tablet; Refill: 2  3. Insomnia, unspecified type  - traZODone  (DESYREL ) 100 MG tablet; Take 1 tablet (100 mg total) by mouth at bedtime as needed for sleep.  Dispense: 30 tablet; Refill: 2  4. Long term current use of antipsychotic medication (Primary)  - EKG 12-Lead  5. Panic disorder with agoraphobia  Patient to follow-up in 2 months  Provider spent a total of 18 minutes with the patient/reviewing patient's chart  Reginia FORBES Bolster, PA 08/02/2024, 10:00 AM

## 2024-08-07 ENCOUNTER — Ambulatory Visit (INDEPENDENT_AMBULATORY_CARE_PROVIDER_SITE_OTHER): Payer: MEDICAID

## 2024-08-07 ENCOUNTER — Encounter (HOSPITAL_COMMUNITY): Payer: Self-pay

## 2024-08-07 DIAGNOSIS — Z79899 Other long term (current) drug therapy: Secondary | ICD-10-CM | POA: Diagnosis not present

## 2024-08-07 DIAGNOSIS — F4310 Post-traumatic stress disorder, unspecified: Secondary | ICD-10-CM

## 2024-08-07 DIAGNOSIS — F4001 Agoraphobia with panic disorder: Secondary | ICD-10-CM | POA: Diagnosis not present

## 2024-08-07 DIAGNOSIS — G4700 Insomnia, unspecified: Secondary | ICD-10-CM | POA: Diagnosis not present

## 2024-08-07 DIAGNOSIS — F315 Bipolar disorder, current episode depressed, severe, with psychotic features: Secondary | ICD-10-CM | POA: Diagnosis not present

## 2024-08-07 NOTE — Progress Notes (Signed)
 Pt presented to office for EKG. Pt tolerated well. Provider will follow up with results. CJT-CMA.

## 2024-09-26 ENCOUNTER — Inpatient Hospital Stay: Payer: MEDICAID

## 2024-09-26 ENCOUNTER — Inpatient Hospital Stay: Payer: MEDICAID | Admitting: Nurse Practitioner

## 2024-10-04 ENCOUNTER — Encounter (HOSPITAL_COMMUNITY): Payer: MEDICAID | Admitting: Physician Assistant
